# Patient Record
Sex: Female | Born: 1950 | Race: Black or African American | Hispanic: No | Smoking: Former smoker
Health system: Southern US, Community
[De-identification: ages and names within clinical notes are randomized; demographics above are authoritative.]

## PROBLEM LIST (undated history)

## (undated) DIAGNOSIS — E119 Type 2 diabetes mellitus without complications: Secondary | ICD-10-CM

## (undated) DIAGNOSIS — E039 Hypothyroidism, unspecified: Secondary | ICD-10-CM

## (undated) HISTORY — PX: ABDOMINAL HYSTERECTOMY: SHX81

---

## 2004-12-03 ENCOUNTER — Ambulatory Visit: Payer: Self-pay | Admitting: Specialist

## 2007-03-02 ENCOUNTER — Ambulatory Visit: Payer: Self-pay | Admitting: Internal Medicine

## 2007-03-10 ENCOUNTER — Ambulatory Visit: Payer: Self-pay | Admitting: Internal Medicine

## 2008-04-24 ENCOUNTER — Emergency Department: Payer: Self-pay | Admitting: Emergency Medicine

## 2008-06-01 ENCOUNTER — Ambulatory Visit: Payer: Self-pay | Admitting: Internal Medicine

## 2009-05-03 ENCOUNTER — Ambulatory Visit: Payer: Self-pay | Admitting: Gastroenterology

## 2009-06-27 ENCOUNTER — Emergency Department: Payer: Self-pay | Admitting: Emergency Medicine

## 2010-11-28 ENCOUNTER — Ambulatory Visit: Payer: Self-pay

## 2012-11-09 ENCOUNTER — Ambulatory Visit: Payer: Self-pay

## 2014-04-06 ENCOUNTER — Ambulatory Visit: Payer: Self-pay

## 2014-04-06 LAB — CBC WITH DIFFERENTIAL/PLATELET
BASOS PCT: 0.7 %
Basophil #: 0 10*3/uL (ref 0.0–0.1)
Eosinophil #: 0.1 10*3/uL (ref 0.0–0.7)
Eosinophil %: 1.9 %
HCT: 40.6 % (ref 35.0–47.0)
HGB: 13 g/dL (ref 12.0–16.0)
LYMPHS PCT: 36.2 %
Lymphocyte #: 2.5 10*3/uL (ref 1.0–3.6)
MCH: 25.4 pg — ABNORMAL LOW (ref 26.0–34.0)
MCHC: 32.1 g/dL (ref 32.0–36.0)
MCV: 79 fL — ABNORMAL LOW (ref 80–100)
MONO ABS: 0.6 x10 3/mm (ref 0.2–0.9)
MONOS PCT: 8.6 %
NEUTROS PCT: 52.6 %
Neutrophil #: 3.6 10*3/uL (ref 1.4–6.5)
Platelet: 172 10*3/uL (ref 150–440)
RBC: 5.13 10*6/uL (ref 3.80–5.20)
RDW: 17.2 % — ABNORMAL HIGH (ref 11.5–14.5)
WBC: 6.9 10*3/uL (ref 3.6–11.0)

## 2014-04-06 LAB — COMPREHENSIVE METABOLIC PANEL
ALBUMIN: 3.7 g/dL (ref 3.4–5.0)
ALK PHOS: 102 U/L
ALT: 23 U/L
Anion Gap: 8 (ref 7–16)
BUN: 23 mg/dL — ABNORMAL HIGH (ref 7–18)
Bilirubin,Total: 0.3 mg/dL (ref 0.2–1.0)
Calcium, Total: 9.7 mg/dL (ref 8.5–10.1)
Chloride: 104 mmol/L (ref 98–107)
Co2: 31 mmol/L (ref 21–32)
Creatinine: 1.11 mg/dL (ref 0.60–1.30)
EGFR (Non-African Amer.): 53 — ABNORMAL LOW
GLUCOSE: 89 mg/dL (ref 65–99)
OSMOLALITY: 288 (ref 275–301)
POTASSIUM: 3.7 mmol/L (ref 3.5–5.1)
SGOT(AST): 19 U/L (ref 15–37)
SODIUM: 143 mmol/L (ref 136–145)
TOTAL PROTEIN: 7.6 g/dL (ref 6.4–8.2)

## 2014-04-06 LAB — RAPID INFLUENZA A&B ANTIGENS (ARMC ONLY)

## 2015-07-04 ENCOUNTER — Encounter: Payer: Self-pay | Admitting: *Deleted

## 2015-07-04 ENCOUNTER — Emergency Department
Admission: EM | Admit: 2015-07-04 | Discharge: 2015-07-04 | Disposition: A | Discharge status: Home / Self Care | Payer: Worker's Compensation | Attending: Emergency Medicine | Admitting: Emergency Medicine

## 2015-07-04 DIAGNOSIS — Y9289 Other specified places as the place of occurrence of the external cause: Secondary | ICD-10-CM | POA: Insufficient documentation

## 2015-07-04 DIAGNOSIS — F17200 Nicotine dependence, unspecified, uncomplicated: Secondary | ICD-10-CM | POA: Insufficient documentation

## 2015-07-04 DIAGNOSIS — E119 Type 2 diabetes mellitus without complications: Secondary | ICD-10-CM | POA: Insufficient documentation

## 2015-07-04 DIAGNOSIS — Y990 Civilian activity done for income or pay: Secondary | ICD-10-CM | POA: Insufficient documentation

## 2015-07-04 DIAGNOSIS — S0591XA Unspecified injury of right eye and orbit, initial encounter: Secondary | ICD-10-CM | POA: Diagnosis not present

## 2015-07-04 DIAGNOSIS — H209 Unspecified iridocyclitis: Secondary | ICD-10-CM | POA: Diagnosis not present

## 2015-07-04 DIAGNOSIS — W228XXA Striking against or struck by other objects, initial encounter: Secondary | ICD-10-CM | POA: Insufficient documentation

## 2015-07-04 DIAGNOSIS — Y9389 Activity, other specified: Secondary | ICD-10-CM | POA: Insufficient documentation

## 2015-07-04 HISTORY — DX: Type 2 diabetes mellitus without complications: E11.9

## 2015-07-04 MED ORDER — TETRACAINE HCL 0.5 % OP SOLN
1.0000 [drp] | Freq: Once | OPHTHALMIC | Status: AC
  Administered 2015-07-04: 1 [drp] via OPHTHALMIC

## 2015-07-04 MED ORDER — TETRACAINE HCL 0.5 % OP SOLN
OPHTHALMIC | Status: AC
  Administered 2015-07-04: 1 [drp] via OPHTHALMIC
  Filled 2015-07-04: qty 2

## 2015-07-04 MED ORDER — TETRACAINE HCL 0.5 % OP SOLN
OPHTHALMIC | Status: AC
  Filled 2015-07-04: qty 2

## 2015-07-04 MED ORDER — ERYTHROMYCIN 5 MG/GM OP OINT: 1.0000 "application " | TOPICAL_OINTMENT | Freq: Three times a day (TID) | OPHTHALMIC | Status: AC

## 2015-07-04 MED ORDER — FLUORESCEIN SODIUM 1 MG OP STRP
1.0000 | ORAL_STRIP | Freq: Once | OPHTHALMIC | Status: AC
  Administered 2015-07-04: 1 via OPHTHALMIC

## 2015-07-04 MED ORDER — ERYTHROMYCIN 5 MG/GM OP OINT
TOPICAL_OINTMENT | Freq: Once | OPHTHALMIC | Status: AC
  Administered 2015-07-04: 1 via OPHTHALMIC
  Filled 2015-07-04: qty 1

## 2015-07-04 MED ORDER — FLUORESCEIN SODIUM 1 MG OP STRP
ORAL_STRIP | OPHTHALMIC | Status: AC
  Filled 2015-07-04: qty 1

## 2015-07-04 MED ORDER — FLUORESCEIN SODIUM 1 MG OP STRP
ORAL_STRIP | OPHTHALMIC | Status: AC
  Administered 2015-07-04: 1 via OPHTHALMIC
  Filled 2015-07-04: qty 1

## 2015-07-04 NOTE — ED Notes (Signed)
Pt was struck in the left eye with a fall mat.   states WC.  Pt denies other injury.   right eye swelling noted

## 2015-07-04 NOTE — ED Notes (Signed)
Visual acuity L eye 20/25 R eye 20/30

## 2015-07-04 NOTE — ED Provider Notes (Signed)
[facility] Emergency Department Provider Note  ____________________________________________  Time seen: Approximately 4:47 AM  I have reviewed the triage vital signs and the nursing notes.   HISTORY  Chief Complaint Eye Injury    HPI Monique Carlson is a 65 y.o. female who comes into the hospital today after an accident at work. The patient reports that her supervisor told her to get some mats down from a high shelf. The patient reports that when she pulled the mat down it hit her in the right eye. She reports that she was sent over as a precaution because her eye became swollen. She reports that she has some eye discomfort and some mild blurred vision but it's not severe. The patient rates her pain a 7 out of 10 in intensity. The patient does not have any significant drainage from her eye. He did not take anything for pain before coming in she just came to the hospital from work.   Past Medical History  Diagnosis Date  . Diabetes mellitus without complication ([place])     There are no active problems to display for this patient.   Past Surgical History  Procedure Laterality Date  . Abdominal hysterectomy      Current Outpatient Rx  Name  Route  Sig  Dispense  Refill  . erythromycin ophthalmic ointment   Right Eye   Place 1 application into the right eye 3 (three) times daily.   3.5 g   0     Allergies Review of patient's allergies indicates no known allergies.  No family history on file.  Social History Social History  Substance Use Topics  . Smoking status: Current Every Day Smoker  . Smokeless tobacco: Not on file  . Alcohol Use: No    Review of Systems Constitutional: No fever/chills Eyes: Right eye blurred vision. ENT: No sore throat. Cardiovascular: Denies chest pain. Respiratory: Denies shortness of breath. Gastrointestinal: No abdominal pain.  No nausea, no vomiting.  No diarrhea.  No constipation. Genitourinary: Negative for  dysuria. Musculoskeletal: Negative for back pain. Skin: Negative for rash. Neurological: Negative for headaches, focal weakness or numbness.  10-point ROS otherwise negative.  ____________________________________________   PHYSICAL EXAM:  VITAL SIGNS: ED Triage Vitals  Enc Vitals Group     BP 07/04/15 0228 155/61 mmHg     Pulse Rate 07/04/15 0228 68     Resp 07/04/15 0228 20     Temp 07/04/15 0228 98.3 F (36.8 C)     Temp Source 07/04/15 0228 Oral     SpO2 07/04/15 0228 100 %     Weight 07/04/15 0228 162 lb (73.483 kg)     Height 07/04/15 0228 5\' 4"  (1.626 m)     Head Cir --      Peak Flow --      Pain Score 07/04/15 0230 8     Pain Loc --      Pain Edu? --      Excl. in [place]? --     Constitutional: Alert and oriented. Well appearing and in mild distress. Eyes: Conjunctivae are normal. PERRL. EOMI. scleral injection with no uptake on fluorescein staining exam visual acuity is 20/25 in the left eye and 20/30 in the right eye Head: Atraumatic. Nose: No congestion/rhinnorhea. Mouth/Throat: Mucous membranes are moist.  Oropharynx non-erythematous. Cardiovascular: Normal rate, regular rhythm. Grossly normal heart sounds.  Good peripheral circulation. Respiratory: Normal respiratory effort.  No retractions. Lungs CTAB. Gastrointestinal: Soft and nontender. No distention. Positive bowel  sounds Musculoskeletal: No lower extremity tenderness nor edema.   Neurologic:  Normal speech and language.  Skin:  Skin is warm, dry and intact. Psychiatric: Mood and affect are normal.  ____________________________________________   LABS (all labs ordered are listed, but only abnormal results are displayed)  Labs Reviewed - No data to display ____________________________________________  EKG  None ____________________________________________  RADIOLOGY  None ____________________________________________   PROCEDURES  Procedure(s) performed: None  Critical Care performed:  No  ____________________________________________   INITIAL IMPRESSION / ASSESSMENT AND PLAN / ED COURSE  Pertinent labs & imaging results that were available during my care of the patient were reviewed by me and considered in my medical decision making (see chart for details).  This is a 65 year old female who comes into the hospital today with an eye injury at work. The patient did have her eye examined and does not appear to have any corneal abrasions. The patient also does not have a hyphema. I will give the patient some erythromycin eye ointment and have her follow-up with the ophthalmologist. The patient will be discharged home. ____________________________________________   FINAL CLINICAL IMPRESSION(S) / ED DIAGNOSES  Final diagnoses:  Eye injury, right, initial encounter  Iritis      Loney Hering, MD 07/04/15 [phone]

## 2016-03-11 ENCOUNTER — Encounter: Payer: Self-pay | Admitting: Emergency Medicine

## 2016-03-11 ENCOUNTER — Telehealth: Payer: Self-pay | Admitting: Nurse Practitioner

## 2016-03-11 ENCOUNTER — Emergency Department
Admission: EM | Admit: 2016-03-11 | Discharge: 2016-03-11 | Disposition: A | Discharge status: Home / Self Care | Payer: Self-pay | Attending: Emergency Medicine | Admitting: Emergency Medicine

## 2016-03-11 DIAGNOSIS — R739 Hyperglycemia, unspecified: Secondary | ICD-10-CM

## 2016-03-11 DIAGNOSIS — F17290 Nicotine dependence, other tobacco product, uncomplicated: Secondary | ICD-10-CM | POA: Insufficient documentation

## 2016-03-11 DIAGNOSIS — E1165 Type 2 diabetes mellitus with hyperglycemia: Secondary | ICD-10-CM | POA: Insufficient documentation

## 2016-03-11 LAB — BASIC METABOLIC PANEL
Anion gap: 9 (ref 5–15)
BUN: 22 mg/dL — ABNORMAL HIGH (ref 6–20)
CALCIUM: 9.7 mg/dL (ref 8.9–10.3)
CO2: 25 mmol/L (ref 22–32)
Chloride: 98 mmol/L — ABNORMAL LOW (ref 101–111)
Creatinine, Ser: 1.1 mg/dL — ABNORMAL HIGH (ref 0.44–1.00)
GFR calc Af Amer: >60 mL/min (ref >60)
GFR, EST NON AFRICAN AMERICAN: 52 mL/min — AB (ref >60)
GLUCOSE: 531 mg/dL — AB (ref 65–99)
Potassium: 3.7 mmol/L (ref 3.5–5.1)
Sodium: 132 mmol/L — ABNORMAL LOW (ref 135–145)

## 2016-03-11 LAB — URINALYSIS COMPLETE WITH MICROSCOPIC (ARMC ONLY)
BACTERIA UA: NONE SEEN
Bilirubin Urine: NEGATIVE
Ketones, ur: NEGATIVE mg/dL
Leukocytes, UA: NEGATIVE
NITRITE: NEGATIVE
PROTEIN: NEGATIVE mg/dL
SPECIFIC GRAVITY, URINE: 1.02 (ref 1.005–1.030)
pH: 5 (ref 5.0–8.0)

## 2016-03-11 LAB — GLUCOSE, CAPILLARY
GLUCOSE-CAPILLARY: 500 mg/dL — AB (ref 65–99)
GLUCOSE-CAPILLARY: 429 mg/dL — AB (ref 65–99)
GLUCOSE-CAPILLARY: 395 mg/dL — AB (ref 65–99)
Glucose-Capillary: 518 mg/dL (ref 65–99)

## 2016-03-11 LAB — CBC
HEMATOCRIT: 38.7 % (ref 35.0–47.0)
Hemoglobin: 12.5 g/dL (ref 12.0–16.0)
MCH: 25 pg — AB (ref 26.0–34.0)
MCHC: 32.2 g/dL (ref 32.0–36.0)
MCV: 77.6 fL — ABNORMAL LOW (ref 80.0–100.0)
Platelets: 164 10*3/uL (ref 150–440)
RBC: 4.99 MIL/uL (ref 3.80–5.20)
RDW: 17.3 % — AB (ref 11.5–14.5)
WBC: 7 10*3/uL (ref 3.6–11.0)

## 2016-03-11 MED ORDER — GLYBURIDE 5 MG PO TABS: 5.0000 mg | ORAL_TABLET | Freq: Every day | ORAL | 3 refills | Status: AC

## 2016-03-11 MED ORDER — INSULIN ASPART 100 UNIT/ML ~~LOC~~ SOLN
8.0000 [IU] | Freq: Once | SUBCUTANEOUS | Status: AC
  Administered 2016-03-11: 8 [IU] via INTRAVENOUS
  Filled 2016-03-11: qty 8

## 2016-03-11 MED ORDER — SODIUM CHLORIDE 0.9 % IV BOLUS (SEPSIS)
1000.0000 mL | Freq: Once | INTRAVENOUS | Status: AC
  Administered 2016-03-11: 1000 mL via INTRAVENOUS

## 2016-03-11 MED ORDER — GLYBURIDE 5 MG PO TABS
5.0000 mg | ORAL_TABLET | Freq: Every day | ORAL | Status: AC
  Administered 2016-03-11: 5 mg via ORAL
  Filled 2016-03-11: qty 1

## 2016-03-11 MED ORDER — LISINOPRIL 10 MG PO TABS: 10.0000 mg | ORAL_TABLET | Freq: Every day | ORAL | 3 refills | Status: DC

## 2016-03-11 NOTE — Telephone Encounter (Signed)
Patient was referred to [facility] by Dr. Owens Shark in emergency department.  She wants to become a new patient at [facility]

## 2016-03-11 NOTE — ED Triage Notes (Signed)
Patient ambulatory to triage with steady gait, without difficulty or distress noted; pt st FSBS elevated this weekend, off meds x 78months; c/o increased thirst

## 2016-03-11 NOTE — ED Notes (Signed)
Pt c/o foul odor from vagina, polydipsia, urinary frequency. Pt has hx of DMII and has not had meds x 3 months secondary to cost and lack of insurance. Pt requires referral for PCP that will work with her regarding insurance coverage and will be given information regarding medication management clinic.

## 2016-03-11 NOTE — ED Provider Notes (Signed)
[facility] Emergency Department Provider Note   First MD Initiated Contact with Patient 03/11/16 [phone]     (approximate)  I have reviewed the triage vital signs and the nursing notes.   HISTORY  Chief Complaint Hyperglycemia   HPI Monique Carlson is a 65 y.o. female with history of type 2 diabetes presents to the emergency department with polydipsia polyuria and hyperglycemia. Patient states that she has been out of her medications for approximately 3 months secondary to lack of insurance. Patient also admits to a history of hypertension and admits to being noncompliant with medications secondary to beforementioned.   Past Medical History:  Diagnosis Date  . Diabetes mellitus without complication ([place])     There are no active problems to display for this patient.   Past Surgical History:  Procedure Laterality Date  . ABDOMINAL HYSTERECTOMY      Prior to Admission medications   Medication Sig Start Date End Date Taking? Authorizing Provider  glyBURIDE (DIABETA) 5 MG tablet Take 1 tablet (5 mg total) by mouth daily with breakfast. 03/11/16   Gregor Hams, MD  lisinopril (PRINIVIL,ZESTRIL) 10 MG tablet Take 1 tablet (10 mg total) by mouth daily. 03/11/16 03/11/17  Gregor Hams, MD    Allergies No known drug allergies No family history on file.  Social History Social History  Substance Use Topics  . Smoking status: Former Research scientist (life sciences)  . Smokeless tobacco: Current User  . Alcohol use No    Review of Systems Constitutional: No fever/chills Eyes: No visual changes. ENT: No sore throat. Cardiovascular: Denies chest pain. Respiratory: Denies shortness of breath. Gastrointestinal: No abdominal pain.  No nausea, no vomiting.  No diarrhea.  No constipation. Genitourinary: Negative for dysuria. Musculoskeletal: Negative for back pain. Skin: Negative for rash. Neurological: Negative for headaches, focal weakness or numbness.  10-point ROS  otherwise negative.  ____________________________________________   PHYSICAL EXAM:  VITAL SIGNS: ED Triage Vitals [03/11/16 0138]  Enc Vitals Group     BP (!) 173/67     Pulse Rate 73     Resp 20     Temp 97.8 F (36.6 C)     Temp Source Oral     SpO2 100 %     Weight 162 lb (73.5 kg)     Height 5\' 4"  (1.626 m)     Head Circumference      Peak Flow      Pain Score      Pain Loc      Pain Edu?      Excl. in [place]?     Constitutional: Alert and oriented. Well appearing and in no acute distress. Eyes: Conjunctivae are normal. PERRL. EOMI. Head: Atraumatic. Mouth/Throat: Mucous membranes are moist.  Oropharynx non-erythematous. Cardiovascular: Normal rate, regular rhythm. Good peripheral circulation. Grossly normal heart sounds. Respiratory: Normal respiratory effort.  No retractions. Lungs CTAB. Gastrointestinal: Soft and nontender. No distention.  Musculoskeletal: No lower extremity tenderness nor edema. No gross deformities of extremities. Neurologic:  Normal speech and language. No gross focal neurologic deficits are appreciated.  Skin:  Skin is warm, dry and intact. No rash noted. Psychiatric: Mood and affect are normal. Speech and behavior are normal.  ____________________________________________   LABS (all labs ordered are listed, but only abnormal results are displayed)  Labs Reviewed  BASIC METABOLIC PANEL - Abnormal; Notable for the following:       Result Value   Sodium 132 (*)    Chloride 98 (*)  Glucose, Bld 531 (*)    BUN 22 (*)    Creatinine, Ser 1.10 (*)    GFR calc non Af Amer 52 (*)    All other components within normal limits  CBC - Abnormal; Notable for the following:    MCV 77.6 (*)    MCH 25.0 (*)    RDW 17.3 (*)    All other components within normal limits  URINALYSIS COMPLETEWITH MICROSCOPIC ([facility] ONLY) - Abnormal; Notable for the following:    Color, Urine STRAW (*)    APPearance CLEAR (*)    Glucose, UA >500 (*)    Hgb urine  dipstick 2+ (*)    Squamous Epithelial / LPF 0-5 (*)    All other components within normal limits  GLUCOSE, CAPILLARY - Abnormal; Notable for the following:    Glucose-Capillary 500 (*)    All other components within normal limits  GLUCOSE, CAPILLARY - Abnormal; Notable for the following:    Glucose-Capillary 518 (*)    All other components within normal limits  GLUCOSE, CAPILLARY - Abnormal; Notable for the following:    Glucose-Capillary 429 (*)    All other components within normal limits  CBG MONITORING, ED  CBG MONITORING, ED      Procedures     INITIAL IMPRESSION / ASSESSMENT AND PLAN / ED COURSE  Pertinent labs & imaging results that were available during my care of the patient were reviewed by me and considered in my medical decision making (see chart for details).  Patient received 2 L IV normal saline and 8 units subcutaneous insulin with improvement of glucose. Patient given dose of glyburide before leaving the emergency department. Patient was prescribed glyburide and lisinopril (patient states this is what she was taking in the past). [facility]   Clinical Course    ____________________________________________  FINAL CLINICAL IMPRESSION(S) / ED DIAGNOSES  Final diagnoses:  Hyperglycemia   type 2 diabetes mellitus   MEDICATIONS GIVEN DURING THIS VISIT:  Medications  sodium chloride 0.9 % bolus 1,000 mL (0 mLs Intravenous Stopped 03/11/16 0408)  insulin aspart (novoLOG) injection 8 Units (8 Units Intravenous Given 03/11/16 0345)     NEW OUTPATIENT MEDICATIONS STARTED DURING THIS VISIT:  New Prescriptions   GLYBURIDE (DIABETA) 5 MG TABLET    Take 1 tablet (5 mg total) by mouth daily with breakfast.   LISINOPRIL (PRINIVIL,ZESTRIL) 10 MG TABLET    Take 1 tablet (10 mg total) by mouth daily.    Modified Medications   No medications on file    Discontinued Medications   No medications on file     Note:  This document  was prepared using Dragon voice recognition software and may include unintentional dictation errors.    Gregor Hams, MD 03/11/16 [phone]

## 2016-06-11 ENCOUNTER — Other Ambulatory Visit: Payer: Self-pay | Admitting: Nurse Practitioner

## 2016-06-11 DIAGNOSIS — Z1231 Encounter for screening mammogram for malignant neoplasm of breast: Secondary | ICD-10-CM

## 2016-06-19 ENCOUNTER — Other Ambulatory Visit: Payer: Self-pay | Admitting: Physician Assistant

## 2016-06-19 DIAGNOSIS — E049 Nontoxic goiter, unspecified: Secondary | ICD-10-CM

## 2016-06-25 ENCOUNTER — Ambulatory Visit: Payer: BLUE CROSS/BLUE SHIELD

## 2016-07-09 ENCOUNTER — Ambulatory Visit
Admission: RE | Admit: 2016-07-09 | Discharge: 2016-07-09 | Disposition: A | Discharge status: Home / Self Care | Payer: BLUE CROSS/BLUE SHIELD | Source: Ambulatory Visit | Attending: Nurse Practitioner | Admitting: Nurse Practitioner

## 2016-07-09 DIAGNOSIS — Z1231 Encounter for screening mammogram for malignant neoplasm of breast: Secondary | ICD-10-CM | POA: Insufficient documentation

## 2016-07-10 ENCOUNTER — Ambulatory Visit: Payer: BLUE CROSS/BLUE SHIELD

## 2016-07-21 ENCOUNTER — Other Ambulatory Visit: Payer: Self-pay | Admitting: *Deleted

## 2016-07-21 ENCOUNTER — Inpatient Hospital Stay
Admission: RE | Admit: 2016-07-21 | Discharge: 2016-07-21 | Disposition: A | Discharge status: Home / Self Care | Payer: Self-pay | Source: Ambulatory Visit | Attending: *Deleted | Admitting: *Deleted

## 2016-07-21 DIAGNOSIS — Z1231 Encounter for screening mammogram for malignant neoplasm of breast: Secondary | ICD-10-CM

## 2017-06-29 ENCOUNTER — Other Ambulatory Visit: Payer: Self-pay | Admitting: Physician Assistant

## 2017-06-29 DIAGNOSIS — I10 Essential (primary) hypertension: Secondary | ICD-10-CM | POA: Diagnosis not present

## 2017-06-29 DIAGNOSIS — Z1231 Encounter for screening mammogram for malignant neoplasm of breast: Secondary | ICD-10-CM

## 2017-06-29 DIAGNOSIS — Z0000 Encounter for general adult medical examination without abnormal findings: Secondary | ICD-10-CM | POA: Diagnosis not present

## 2017-06-29 DIAGNOSIS — Z1211 Encounter for screening for malignant neoplasm of colon: Secondary | ICD-10-CM | POA: Diagnosis not present

## 2017-06-29 DIAGNOSIS — Z1389 Encounter for screening for other disorder: Secondary | ICD-10-CM | POA: Diagnosis not present

## 2017-06-29 DIAGNOSIS — Z13820 Encounter for screening for osteoporosis: Secondary | ICD-10-CM

## 2017-06-29 DIAGNOSIS — Z23 Encounter for immunization: Secondary | ICD-10-CM | POA: Diagnosis not present

## 2017-07-13 ENCOUNTER — Telehealth: Payer: Self-pay

## 2017-07-16 NOTE — Telephone Encounter (Signed)
Gastroenterology Pre-Procedure Review  Request Date: 07/30/17 Requesting Physician: Dr. Vicente Males   PATIENT REVIEW QUESTIONS: The patient responded to the following health history questions as indicated:    1. Are you having any GI issues? No  2. Do you have a personal history of Polyps? No  3. Do you have a family history of Colon Cancer or Polyps? No  4. Diabetes Mellitus? Yes  5. Joint replacements in the past 12 months? No  6. Major health problems in the past 3 months? No  7. Any artificial heart valves, MVP, or defibrillator?no     MEDICATIONS & ALLERGIES:    Patient reports the following regarding taking any anticoagulation/antiplatelet therapy:   Plavix, Coumadin, Eliquis, Xarelto, Lovenox, Pradaxa, Brilinta, or Effient? No  Aspirin? No   Patient confirms/reports the following medications:  Current Outpatient Medications  Medication Sig Dispense Refill  . amLODipine (NORVASC) 5 MG tablet Take 5 mg by mouth daily.    Marland Kitchen glyBURIDE (DIABETA) 5 MG tablet Take 1 tablet (5 mg total) by mouth daily with breakfast. 30 tablet 3  . insulin detemir (LEVEMIR) 100 UNIT/ML injection Inject 10 Units into the skin at bedtime.    Marland Kitchen levothyroxine (SYNTHROID, LEVOTHROID) 25 MCG tablet Take 25 mcg by mouth daily before breakfast.    . linagliptin (TRADJENTA) 5 MG TABS tablet Take 5 mg by mouth daily.    . meloxicam (MOBIC) 15 MG tablet Take 15 mg by mouth daily.    . Omega-3 Fatty Acids (FISH OIL PO) Take by mouth.     No current facility-administered medications for this visit.     Patient confirms/reports the following allergies:  Allergies  Allergen Reactions  . Metformin And Related     No orders of the defined types were placed in this encounter.   AUTHORIZATION INFORMATION Primary Insurance: 1D#: Group #:  Secondary Insurance: 1D#: Group #:  SCHEDULE INFORMATION: Date: 07/30/17 Time: Location: [place]

## 2017-07-18 ENCOUNTER — Other Ambulatory Visit: Payer: Self-pay

## 2017-07-18 DIAGNOSIS — Z1211 Encounter for screening for malignant neoplasm of colon: Secondary | ICD-10-CM

## 2017-07-23 ENCOUNTER — Ambulatory Visit
Admission: RE | Admit: 2017-07-23 | Discharge: 2017-07-23 | Disposition: A | Discharge status: Home / Self Care | Payer: Medicare HMO | Source: Ambulatory Visit | Attending: Physician Assistant | Admitting: Physician Assistant

## 2017-07-23 DIAGNOSIS — Z780 Asymptomatic menopausal state: Secondary | ICD-10-CM | POA: Diagnosis not present

## 2017-07-23 DIAGNOSIS — Z1231 Encounter for screening mammogram for malignant neoplasm of breast: Secondary | ICD-10-CM | POA: Insufficient documentation

## 2017-07-23 DIAGNOSIS — Z13820 Encounter for screening for osteoporosis: Secondary | ICD-10-CM | POA: Diagnosis not present

## 2017-07-29 DIAGNOSIS — R69 Illness, unspecified: Secondary | ICD-10-CM | POA: Diagnosis not present

## 2017-07-30 ENCOUNTER — Encounter: Payer: Self-pay | Admitting: Anesthesiology

## 2017-07-30 ENCOUNTER — Ambulatory Visit: Payer: Medicare HMO | Admitting: Anesthesiology

## 2017-07-30 ENCOUNTER — Encounter
Admission: RE | Disposition: A | Discharge status: Home / Self Care | Payer: Self-pay | Source: Ambulatory Visit | Attending: Gastroenterology

## 2017-07-30 ENCOUNTER — Ambulatory Visit
Admission: RE | Admit: 2017-07-30 | Discharge: 2017-07-30 | Disposition: A | Discharge status: Home / Self Care | Payer: Medicare HMO | Source: Ambulatory Visit | Attending: Gastroenterology | Admitting: Gastroenterology

## 2017-07-30 DIAGNOSIS — E119 Type 2 diabetes mellitus without complications: Secondary | ICD-10-CM | POA: Insufficient documentation

## 2017-07-30 DIAGNOSIS — Z1211 Encounter for screening for malignant neoplasm of colon: Secondary | ICD-10-CM | POA: Diagnosis not present

## 2017-07-30 DIAGNOSIS — Z87891 Personal history of nicotine dependence: Secondary | ICD-10-CM | POA: Diagnosis not present

## 2017-07-30 DIAGNOSIS — Z794 Long term (current) use of insulin: Secondary | ICD-10-CM | POA: Insufficient documentation

## 2017-07-30 DIAGNOSIS — Z79890 Hormone replacement therapy (postmenopausal): Secondary | ICD-10-CM | POA: Insufficient documentation

## 2017-07-30 DIAGNOSIS — K5790 Diverticulosis of intestine, part unspecified, without perforation or abscess without bleeding: Secondary | ICD-10-CM | POA: Diagnosis not present

## 2017-07-30 DIAGNOSIS — E039 Hypothyroidism, unspecified: Secondary | ICD-10-CM | POA: Insufficient documentation

## 2017-07-30 DIAGNOSIS — Z79899 Other long term (current) drug therapy: Secondary | ICD-10-CM | POA: Diagnosis not present

## 2017-07-30 DIAGNOSIS — K5730 Diverticulosis of large intestine without perforation or abscess without bleeding: Secondary | ICD-10-CM

## 2017-07-30 HISTORY — DX: Hypothyroidism, unspecified: E03.9

## 2017-07-30 HISTORY — PX: COLONOSCOPY WITH PROPOFOL: SHX5780

## 2017-07-30 LAB — GLUCOSE, CAPILLARY: Glucose-Capillary: 204 mg/dL — ABNORMAL HIGH (ref 65–99)

## 2017-07-30 SURGERY — COLONOSCOPY WITH PROPOFOL
Anesthesia: General

## 2017-07-30 MED ORDER — FENTANYL CITRATE (PF) 100 MCG/2ML IJ SOLN
INTRAMUSCULAR | Status: DC | PRN
  Administered 2017-07-30: 50 ug via INTRAVENOUS

## 2017-07-30 MED ORDER — PROPOFOL 500 MG/50ML IV EMUL
INTRAVENOUS | Status: AC
  Filled 2017-07-30: qty 50

## 2017-07-30 MED ORDER — PROPOFOL 500 MG/50ML IV EMUL
INTRAVENOUS | Status: DC | PRN
  Administered 2017-07-30: 120 ug/kg/min via INTRAVENOUS

## 2017-07-30 MED ORDER — FENTANYL CITRATE (PF) 100 MCG/2ML IJ SOLN
INTRAMUSCULAR | Status: AC
  Filled 2017-07-30: qty 2

## 2017-07-30 MED ORDER — SODIUM CHLORIDE 0.9 % IV SOLN
INTRAVENOUS | Status: DC
  Administered 2017-07-30: 1000 mL via INTRAVENOUS

## 2017-07-30 MED ORDER — MIDAZOLAM HCL 2 MG/2ML IJ SOLN
INTRAMUSCULAR | Status: AC
  Filled 2017-07-30: qty 2

## 2017-07-30 MED ORDER — MIDAZOLAM HCL 2 MG/2ML IJ SOLN
INTRAMUSCULAR | Status: DC | PRN
  Administered 2017-07-30: 2 mg via INTRAVENOUS

## 2017-07-30 NOTE — Anesthesia Procedure Notes (Signed)
Performed by: Vaughan Sine Pre-anesthesia Checklist: Patient identified, Emergency Drugs available, Suction available, Patient being monitored and Timeout performed Patient Re-evaluated:Patient Re-evaluated prior to induction Oxygen Delivery Method: Nasal cannula Preoxygenation: Pre-oxygenation with 100% oxygen Induction Type: IV induction Ventilation: Oral airway inserted - appropriate to patient size and Nasal airway inserted- appropriate to patient size Placement Confirmation: CO2 detector and positive ETCO2

## 2017-07-30 NOTE — Anesthesia Post-op Follow-up Note (Signed)
Anesthesia QCDR form completed.        

## 2017-07-30 NOTE — H&P (Signed)
Jonathon Bellows, MD [address], [place], [place], [place], [address] [id number], [place], [place], [place], [address] Phone: [phone]  Fax: [phone]  Primary Care Physician:  Ronnell Freshwater, NP   Pre-Procedure History & Physical: HPI:  Monique Carlson is a 67 y.o. female is here for an colonoscopy.   Past Medical History:  Diagnosis Date  . Diabetes mellitus without complication ([place])   . Hypothyroidism     Past Surgical History:  Procedure Laterality Date  . ABDOMINAL HYSTERECTOMY      Prior to Admission medications   Medication Sig Start Date End Date Taking? Authorizing Provider  amLODipine (NORVASC) 5 MG tablet Take 5 mg by mouth daily.   Yes [provider]  glyBURIDE (DIABETA) 5 MG tablet Take 1 tablet (5 mg total) by mouth daily with breakfast. 03/11/16  Yes Gregor Hams, MD  insulin detemir (LEVEMIR) 100 UNIT/ML injection Inject 10 Units into the skin at bedtime.   Yes [provider]  levothyroxine (SYNTHROID, LEVOTHROID) 25 MCG tablet Take 25 mcg by mouth daily before breakfast.   Yes [provider]  linagliptin (TRADJENTA) 5 MG TABS tablet Take 5 mg by mouth daily.   Yes [provider]  meloxicam (MOBIC) 15 MG tablet Take 15 mg by mouth daily.   Yes [provider]  Omega-3 Fatty Acids (FISH OIL PO) Take by mouth.   Yes [provider]    Allergies as of 07/20/2017 - Review Complete 07/16/2017  Allergen Reaction Noted  . Metformin and related  07/16/2017    History reviewed. No pertinent family history.  Social History   Socioeconomic History  . Marital status: Widowed    Spouse name: Not on file  . Number of children: Not on file  . Years of education: Not on file  . Highest education level: Not on file  Social Needs  . Financial resource strain: Not on file  . Food insecurity - worry: Not on file  . Food insecurity - inability: Not on file  . Transportation needs -  medical: Not on file  . Transportation needs - non-medical: Not on file  Occupational History  . Not on file  Tobacco Use  . Smoking status: Former Research scientist (life sciences)  . Smokeless tobacco: Current User  Substance and Sexual Activity  . Alcohol use: No  . Drug use: Not on file  . Sexual activity: Not on file  Other Topics Concern  . Not on file  Social History Narrative  . Not on file    Review of Systems: See HPI, otherwise negative ROS  Physical Exam: BP (!) 122/50   Pulse 71   Temp 97.7 F (36.5 C) (Tympanic)   Resp 20   Ht 5\' 4"  (1.626 m)   Wt 180 lb (81.6 kg)   SpO2 100%   BMI 30.90 kg/m  General:   Alert,  pleasant and cooperative in NAD Head:  Normocephalic and atraumatic. Neck:  Supple; no masses or thyromegaly. Lungs:  Clear throughout to auscultation, normal respiratory effort.    Heart:  +S1, +S2, Regular rate and rhythm, No edema. Abdomen:  Soft, nontender and nondistended. Normal bowel sounds, without guarding, and without rebound.   Neurologic:  Alert and  oriented x4;  grossly normal neurologically.  Impression/Plan: EILIYAH REH is here for an colonoscopy to be performed for Screening colonoscopy average risk   Risks, benefits, limitations, and alternatives regarding  colonoscopy have been reviewed with the patient.  Questions have been answered.  All parties agreeable.   Jonathon Bellows, MD  07/30/2017, 8:51 AM

## 2017-07-30 NOTE — Anesthesia Preprocedure Evaluation (Signed)
Anesthesia Evaluation  Patient identified by MRN, date of birth, ID band Patient awake    Reviewed: Allergy & Precautions, H&P , NPO status , Patient's Chart, lab work & pertinent test results, reviewed documented beta blocker date and time   History of Anesthesia Complications Negative for: history of anesthetic complications  Airway Mallampati: III  TM Distance: >3 FB Neck ROM: full    Dental  (+) Dental Advidsory Given, Missing   Pulmonary neg pulmonary ROS, former smoker,           Cardiovascular Exercise Tolerance: Good (-) hypertension(-) angina(-) CAD, (-) Past MI, (-) Cardiac Stents and (-) CABG (-) dysrhythmias + Valvular Problems/Murmurs      Neuro/Psych negative neurological ROS  negative psych ROS   GI/Hepatic negative GI ROS, Neg liver ROS,   Endo/Other  diabetesHypothyroidism   Renal/GU negative Renal ROS  negative genitourinary   Musculoskeletal   Abdominal   Peds  Hematology negative hematology ROS (+)   Anesthesia Other Findings Past Medical History: No date: Diabetes mellitus without complication (H[facility]) No date: Hypothyroidism   Reproductive/Obstetrics negative OB ROS                             Anesthesia Physical Anesthesia Plan  ASA: II  Anesthesia Plan: General   Post-op Pain Management:    Induction: Intravenous  PONV Risk Score and Plan: 3 and Propofol infusion  Airway Management Planned: Natural Airway and Nasal Cannula  Additional Equipment:   Intra-op Plan:   Post-operative Plan:   Informed Consent: I have reviewed the patients History and Physical, chart, labs and discussed the procedure including the risks, benefits and alternatives for the proposed anesthesia with the patient or authorized representative who has indicated his/her understanding and acceptance.   Dental Advisory Given  Plan Discussed with: Anesthesiologist, CRNA and  Surgeon  Anesthesia Plan Comments:         Anesthesia Quick Evaluation

## 2017-07-30 NOTE — Transfer of Care (Signed)
Immediate Anesthesia Transfer of Care Note  Patient: Monique Carlson  Procedure(s) Performed: COLONOSCOPY WITH PROPOFOL (N/A )  Patient Location: PACU  Anesthesia Type:General  Level of Consciousness: awake and sedated  Airway & Oxygen Therapy: Patient Spontanous Breathing and Patient connected to face mask oxygen  Post-op Assessment: Report given to RN and Post -op Vital signs reviewed and stable  Post vital signs: Reviewed and stable  Last Vitals:  Vitals:   07/30/17 0846  BP: (!) 122/50  Pulse: 71  Resp: 20  Temp: 36.5 C  SpO2: 100%    Last Pain:  Vitals:   07/30/17 0846  TempSrc: Tympanic         Complications: No apparent anesthesia complications

## 2017-07-30 NOTE — Op Note (Signed)
[facility] Patient Name: Monique Carlson Procedure Date: 07/30/2017 9:32 AM MRN: [id number] Account #: [id number] Date of Birth: [birth year] Admit Type: Outpatient Age: 67 Room: [facility] ENDO ROOM 4 Gender: Female Note Status: Finalized Procedure:            Colonoscopy Indications:          Screening for colorectal malignant neoplasm Providers:            Jonathon Bellows MD, MD Referring MD:         No Local Md, MD (Referring MD) Medicines:            Monitored Anesthesia Care Complications:        No immediate complications. Procedure:            Pre-Anesthesia Assessment:                       - Prior to the procedure, a History and Physical was                        performed, and patient medications, allergies and                        sensitivities were reviewed. The patient's tolerance of                        previous anesthesia was reviewed.                       - The risks and benefits of the procedure and the                        sedation options and risks were discussed with the                        patient. All questions were answered and informed                        consent was obtained.                       - ASA Grade Assessment: III - A patient with severe                        systemic disease.                       After obtaining informed consent, the colonoscope was                        passed under direct vision. Throughout the procedure,                        the patient's blood pressure, pulse, and oxygen                        saturations were monitored continuously. The                        Colonoscope was introduced through the anus and  advanced to the the cecum, identified by the                        appendiceal orifice, IC valve and transillumination.                        The colonoscopy was performed with ease. The patient                        tolerated the procedure well. The  quality of the bowel                        preparation was good. Findings:      The perianal and digital rectal examinations were normal.      Multiple small-mouthed diverticula were found in the entire colon.      The exam was otherwise without abnormality on direct and retroflexion       views. Impression:           - Diverticulosis in the entire examined colon.                       - The examination was otherwise normal on direct and                        retroflexion views.                       - No specimens collected. Recommendation:       - Discharge patient to home (with escort).                       - Resume previous diet.                       - Continue present medications.                       - Repeat colonoscopy in 10 years for screening purposes. Procedure Code(s):    --- Professional ---                       [phone], Colonoscopy, flexible; diagnostic, including                        collection of specimen(s) by brushing or washing, when                        performed (separate procedure) Diagnosis Code(s):    --- Professional ---                       Z12.11, Encounter for screening for malignant neoplasm                        of colon                       K57.30, Diverticulosis of large intestine without                        perforation or abscess without bleeding CPT copyright 2016 American Medical Association. All rights reserved. The codes documented in this report are  preliminary and upon coder review may  be revised to meet current compliance requirements. Jonathon Bellows, MD Jonathon Bellows MD, MD 07/30/2017 9:53:44 AM This report has been signed electronically. Number of Addenda: 0 Note Initiated On: 07/30/2017 9:32 AM Scope Withdrawal Time: 0 hours 11 minutes 25 seconds  Total Procedure Duration: 0 hours 14 minutes 43 seconds       [facility]

## 2017-07-31 ENCOUNTER — Encounter: Payer: Self-pay | Admitting: Gastroenterology

## 2017-08-02 NOTE — Anesthesia Postprocedure Evaluation (Signed)
Anesthesia Post Note  Patient: CRYTAL PENSINGER  Procedure(s) Performed: COLONOSCOPY WITH PROPOFOL (N/A )  Patient location during evaluation: Endoscopy Anesthesia Type: General Level of consciousness: awake and alert Pain management: pain level controlled Vital Signs Assessment: post-procedure vital signs reviewed and stable Respiratory status: spontaneous breathing, nonlabored ventilation, respiratory function stable and patient connected to nasal cannula oxygen Cardiovascular status: blood pressure returned to baseline and stable Postop Assessment: no apparent nausea or vomiting Anesthetic complications: no     Last Vitals:  Vitals:   07/30/17 0846 07/30/17 0958  BP: (!) 122/50 136/64  Pulse: 71 69  Resp: 20 13  Temp: 36.5 C (!) 36.1 C  SpO2: 100% 100%    Last Pain:  Vitals:   07/30/17 0958  TempSrc: Tympanic                 Martha Clan

## 2017-08-06 DIAGNOSIS — E1165 Type 2 diabetes mellitus with hyperglycemia: Secondary | ICD-10-CM | POA: Diagnosis not present

## 2017-08-06 DIAGNOSIS — R69 Illness, unspecified: Secondary | ICD-10-CM | POA: Diagnosis not present

## 2017-08-06 DIAGNOSIS — Z1329 Encounter for screening for other suspected endocrine disorder: Secondary | ICD-10-CM | POA: Diagnosis not present

## 2017-08-26 ENCOUNTER — Ambulatory Visit: Payer: Self-pay | Admitting: Internal Medicine

## 2017-09-14 DIAGNOSIS — R69 Illness, unspecified: Secondary | ICD-10-CM | POA: Diagnosis not present

## 2017-09-28 DIAGNOSIS — R29898 Other symptoms and signs involving the musculoskeletal system: Secondary | ICD-10-CM | POA: Diagnosis not present

## 2017-09-28 DIAGNOSIS — E1165 Type 2 diabetes mellitus with hyperglycemia: Secondary | ICD-10-CM | POA: Diagnosis not present

## 2017-09-28 DIAGNOSIS — I10 Essential (primary) hypertension: Secondary | ICD-10-CM | POA: Diagnosis not present

## 2017-09-28 DIAGNOSIS — Z1329 Encounter for screening for other suspected endocrine disorder: Secondary | ICD-10-CM | POA: Diagnosis not present

## 2017-10-14 DIAGNOSIS — G8929 Other chronic pain: Secondary | ICD-10-CM | POA: Diagnosis not present

## 2017-10-14 DIAGNOSIS — Z794 Long term (current) use of insulin: Secondary | ICD-10-CM | POA: Diagnosis not present

## 2017-10-14 DIAGNOSIS — Z791 Long term (current) use of non-steroidal anti-inflammatories (NSAID): Secondary | ICD-10-CM | POA: Diagnosis not present

## 2017-10-14 DIAGNOSIS — E039 Hypothyroidism, unspecified: Secondary | ICD-10-CM | POA: Diagnosis not present

## 2017-10-14 DIAGNOSIS — E669 Obesity, unspecified: Secondary | ICD-10-CM | POA: Diagnosis not present

## 2017-10-14 DIAGNOSIS — I10 Essential (primary) hypertension: Secondary | ICD-10-CM | POA: Diagnosis not present

## 2017-10-14 DIAGNOSIS — Z8249 Family history of ischemic heart disease and other diseases of the circulatory system: Secondary | ICD-10-CM | POA: Diagnosis not present

## 2017-10-14 DIAGNOSIS — K08409 Partial loss of teeth, unspecified cause, unspecified class: Secondary | ICD-10-CM | POA: Diagnosis not present

## 2017-10-14 DIAGNOSIS — Z6831 Body mass index (BMI) 31.0-31.9, adult: Secondary | ICD-10-CM | POA: Diagnosis not present

## 2017-10-14 DIAGNOSIS — R69 Illness, unspecified: Secondary | ICD-10-CM | POA: Diagnosis not present

## 2017-10-14 DIAGNOSIS — E119 Type 2 diabetes mellitus without complications: Secondary | ICD-10-CM | POA: Diagnosis not present

## 2017-10-20 DIAGNOSIS — R69 Illness, unspecified: Secondary | ICD-10-CM | POA: Diagnosis not present

## 2017-11-05 DIAGNOSIS — I10 Essential (primary) hypertension: Secondary | ICD-10-CM | POA: Diagnosis not present

## 2017-11-05 DIAGNOSIS — E1165 Type 2 diabetes mellitus with hyperglycemia: Secondary | ICD-10-CM | POA: Diagnosis not present

## 2017-11-21 DIAGNOSIS — R69 Illness, unspecified: Secondary | ICD-10-CM | POA: Diagnosis not present

## 2017-12-24 DIAGNOSIS — E039 Hypothyroidism, unspecified: Secondary | ICD-10-CM | POA: Diagnosis not present

## 2017-12-24 DIAGNOSIS — E1165 Type 2 diabetes mellitus with hyperglycemia: Secondary | ICD-10-CM | POA: Diagnosis not present

## 2017-12-24 DIAGNOSIS — I10 Essential (primary) hypertension: Secondary | ICD-10-CM | POA: Diagnosis not present

## 2017-12-24 DIAGNOSIS — Z1329 Encounter for screening for other suspected endocrine disorder: Secondary | ICD-10-CM | POA: Diagnosis not present

## 2017-12-24 DIAGNOSIS — R69 Illness, unspecified: Secondary | ICD-10-CM | POA: Diagnosis not present

## 2018-01-21 DIAGNOSIS — E1165 Type 2 diabetes mellitus with hyperglycemia: Secondary | ICD-10-CM | POA: Diagnosis not present

## 2018-01-21 DIAGNOSIS — I10 Essential (primary) hypertension: Secondary | ICD-10-CM | POA: Diagnosis not present

## 2018-02-25 DIAGNOSIS — R69 Illness, unspecified: Secondary | ICD-10-CM | POA: Diagnosis not present

## 2018-03-31 DIAGNOSIS — R69 Illness, unspecified: Secondary | ICD-10-CM | POA: Diagnosis not present

## 2018-06-09 DIAGNOSIS — E039 Hypothyroidism, unspecified: Secondary | ICD-10-CM | POA: Diagnosis not present

## 2018-06-09 DIAGNOSIS — E049 Nontoxic goiter, unspecified: Secondary | ICD-10-CM | POA: Diagnosis not present

## 2018-06-09 DIAGNOSIS — E1165 Type 2 diabetes mellitus with hyperglycemia: Secondary | ICD-10-CM | POA: Diagnosis not present

## 2018-06-09 DIAGNOSIS — Z1389 Encounter for screening for other disorder: Secondary | ICD-10-CM | POA: Diagnosis not present

## 2018-06-09 DIAGNOSIS — R3121 Asymptomatic microscopic hematuria: Secondary | ICD-10-CM | POA: Diagnosis not present

## 2018-06-09 DIAGNOSIS — I10 Essential (primary) hypertension: Secondary | ICD-10-CM | POA: Diagnosis not present

## 2018-07-09 DIAGNOSIS — E119 Type 2 diabetes mellitus without complications: Secondary | ICD-10-CM | POA: Diagnosis not present

## 2018-07-22 DIAGNOSIS — Z23 Encounter for immunization: Secondary | ICD-10-CM | POA: Diagnosis not present

## 2018-07-22 DIAGNOSIS — Z1389 Encounter for screening for other disorder: Secondary | ICD-10-CM | POA: Diagnosis not present

## 2018-07-22 DIAGNOSIS — Z1329 Encounter for screening for other suspected endocrine disorder: Secondary | ICD-10-CM | POA: Diagnosis not present

## 2018-07-22 DIAGNOSIS — Z0000 Encounter for general adult medical examination without abnormal findings: Secondary | ICD-10-CM | POA: Diagnosis not present

## 2018-07-26 DIAGNOSIS — R69 Illness, unspecified: Secondary | ICD-10-CM | POA: Diagnosis not present

## 2018-12-17 DIAGNOSIS — R69 Illness, unspecified: Secondary | ICD-10-CM | POA: Diagnosis not present

## 2019-02-16 DIAGNOSIS — E1165 Type 2 diabetes mellitus with hyperglycemia: Secondary | ICD-10-CM | POA: Diagnosis not present

## 2019-02-16 DIAGNOSIS — E039 Hypothyroidism, unspecified: Secondary | ICD-10-CM | POA: Diagnosis not present

## 2019-02-16 DIAGNOSIS — Z23 Encounter for immunization: Secondary | ICD-10-CM | POA: Diagnosis not present

## 2019-02-16 DIAGNOSIS — L03116 Cellulitis of left lower limb: Secondary | ICD-10-CM | POA: Diagnosis not present

## 2019-03-03 DIAGNOSIS — M170 Bilateral primary osteoarthritis of knee: Secondary | ICD-10-CM | POA: Diagnosis not present

## 2019-03-14 DIAGNOSIS — R69 Illness, unspecified: Secondary | ICD-10-CM | POA: Diagnosis not present

## 2019-05-20 ENCOUNTER — Emergency Department: Payer: Medicare HMO

## 2019-05-20 ENCOUNTER — Encounter: Payer: Self-pay | Admitting: Intensive Care

## 2019-05-20 ENCOUNTER — Other Ambulatory Visit: Payer: Self-pay

## 2019-05-20 ENCOUNTER — Emergency Department
Admission: EM | Admit: 2019-05-20 | Discharge: 2019-05-20 | Disposition: A | Discharge status: Home / Self Care | Payer: Medicare HMO | Attending: Emergency Medicine | Admitting: Emergency Medicine

## 2019-05-20 DIAGNOSIS — R911 Solitary pulmonary nodule: Secondary | ICD-10-CM | POA: Diagnosis not present

## 2019-05-20 DIAGNOSIS — U071 COVID-19: Secondary | ICD-10-CM

## 2019-05-20 DIAGNOSIS — Z794 Long term (current) use of insulin: Secondary | ICD-10-CM | POA: Diagnosis not present

## 2019-05-20 DIAGNOSIS — Z79899 Other long term (current) drug therapy: Secondary | ICD-10-CM | POA: Insufficient documentation

## 2019-05-20 DIAGNOSIS — Z7189 Other specified counseling: Secondary | ICD-10-CM

## 2019-05-20 DIAGNOSIS — E119 Type 2 diabetes mellitus without complications: Secondary | ICD-10-CM | POA: Insufficient documentation

## 2019-05-20 DIAGNOSIS — R5383 Other fatigue: Secondary | ICD-10-CM | POA: Insufficient documentation

## 2019-05-20 DIAGNOSIS — E039 Hypothyroidism, unspecified: Secondary | ICD-10-CM | POA: Insufficient documentation

## 2019-05-20 DIAGNOSIS — R69 Illness, unspecified: Secondary | ICD-10-CM | POA: Diagnosis not present

## 2019-05-20 DIAGNOSIS — R630 Anorexia: Secondary | ICD-10-CM | POA: Insufficient documentation

## 2019-05-20 DIAGNOSIS — R05 Cough: Secondary | ICD-10-CM | POA: Diagnosis not present

## 2019-05-20 DIAGNOSIS — F17228 Nicotine dependence, chewing tobacco, with other nicotine-induced disorders: Secondary | ICD-10-CM | POA: Diagnosis not present

## 2019-05-20 LAB — COMPREHENSIVE METABOLIC PANEL
ALT: 25 U/L (ref 0–44)
AST: 36 U/L (ref 15–41)
Albumin: 3.5 g/dL (ref 3.5–5.0)
Alkaline Phosphatase: 61 U/L (ref 38–126)
Anion gap: 11 (ref 5–15)
BUN: 13 mg/dL (ref 8–23)
CO2: 25 mmol/L (ref 22–32)
Calcium: 8.9 mg/dL (ref 8.9–10.3)
Chloride: 98 mmol/L (ref 98–111)
Creatinine, Ser: 1.12 mg/dL — ABNORMAL HIGH (ref 0.44–1.00)
GFR calc Af Amer: 59 mL/min — ABNORMAL LOW (ref >60)
GFR calc non Af Amer: 51 mL/min — ABNORMAL LOW (ref >60)
Glucose, Bld: 68 mg/dL — ABNORMAL LOW (ref 70–99)
Potassium: 3.5 mmol/L (ref 3.5–5.1)
Sodium: 134 mmol/L — ABNORMAL LOW (ref 135–145)
Total Bilirubin: 0.7 mg/dL (ref 0.3–1.2)
Total Protein: 8.2 g/dL — ABNORMAL HIGH (ref 6.5–8.1)

## 2019-05-20 LAB — URINALYSIS, COMPLETE (UACMP) WITH MICROSCOPIC
Bacteria, UA: NONE SEEN
Bilirubin Urine: NEGATIVE
Glucose, UA: NEGATIVE mg/dL
Hgb urine dipstick: NEGATIVE
Ketones, ur: NEGATIVE mg/dL
Nitrite: NEGATIVE
Protein, ur: 100 mg/dL — AB
Specific Gravity, Urine: 1.014 (ref 1.005–1.030)
pH: 5 (ref 5.0–8.0)

## 2019-05-20 LAB — CBC WITH DIFFERENTIAL/PLATELET
Abs Immature Granulocytes: 0.04 10*3/uL (ref 0.00–0.07)
Basophils Absolute: 0 10*3/uL (ref 0.0–0.1)
Basophils Relative: 0 %
Eosinophils Absolute: 0 10*3/uL (ref 0.0–0.5)
Eosinophils Relative: 0 %
HCT: 34.8 % — ABNORMAL LOW (ref 36.0–46.0)
Hemoglobin: 10.9 g/dL — ABNORMAL LOW (ref 12.0–15.0)
Immature Granulocytes: 1 %
Lymphocytes Relative: 14 %
Lymphs Abs: 0.9 10*3/uL (ref 0.7–4.0)
MCH: 23.9 pg — ABNORMAL LOW (ref 26.0–34.0)
MCHC: 31.3 g/dL (ref 30.0–36.0)
MCV: 76.1 fL — ABNORMAL LOW (ref 80.0–100.0)
Monocytes Absolute: 0.2 10*3/uL (ref 0.1–1.0)
Monocytes Relative: 4 %
Neutro Abs: 5.1 10*3/uL (ref 1.7–7.7)
Neutrophils Relative %: 81 %
Platelets: 269 10*3/uL (ref 150–400)
RBC: 4.57 MIL/uL (ref 3.87–5.11)
RDW: 18.6 % — ABNORMAL HIGH (ref 11.5–15.5)
Smear Review: NORMAL
WBC: 6.2 10*3/uL (ref 4.0–10.5)
nRBC: 0 % (ref 0.0–0.2)

## 2019-05-20 LAB — GLUCOSE, CAPILLARY
Glucose-Capillary: 33 mg/dL — CL (ref 70–99)
Glucose-Capillary: 90 mg/dL (ref 70–99)
Glucose-Capillary: 149 mg/dL — ABNORMAL HIGH (ref 70–99)

## 2019-05-20 MED ORDER — ONDANSETRON 4 MG PO TBDP: 4.0000 mg | ORAL_TABLET | Freq: Four times a day (QID) | ORAL | 0 refills | Status: AC | PRN

## 2019-05-20 MED ORDER — ACETAMINOPHEN 325 MG PO TABS
650.0000 mg | ORAL_TABLET | Freq: Once | ORAL | Status: AC
  Administered 2019-05-20: 650 mg via ORAL
  Filled 2019-05-20: qty 2

## 2019-05-20 MED ORDER — BENZONATATE 100 MG PO CAPS: 100.0000 mg | ORAL_CAPSULE | Freq: Four times a day (QID) | ORAL | 0 refills | Status: DC | PRN

## 2019-05-20 MED ORDER — ONDANSETRON HCL 4 MG/2ML IJ SOLN
4.0000 mg | Freq: Once | INTRAMUSCULAR | Status: AC
  Administered 2019-05-20: 13:00:00 4 mg via INTRAVENOUS
  Filled 2019-05-20: qty 2

## 2019-05-20 MED ORDER — SODIUM CHLORIDE 0.9 % IV BOLUS
1000.0000 mL | Freq: Once | INTRAVENOUS | Status: AC
  Administered 2019-05-20: 1000 mL via INTRAVENOUS

## 2019-05-20 NOTE — ED Triage Notes (Signed)
Patient reports being diagnosed with covid 05/16/19 and now experiencing worsening symptoms of cough, sob, no appetite and frequent urination

## 2019-05-20 NOTE — ED Notes (Signed)
First Nurse Note: Pt states that she tested positive for COVID at her work. Pt ambulatory into ED c/o weakness, cough, and shortness of breath. No respiratory distress is noted at this time. Pt is able to speak in complete sentences at this time.

## 2019-05-20 NOTE — ED Provider Notes (Signed)
[facility] Emergency Department Provider Note   ____________________________________________   First MD Initiated Contact with Patient 05/20/19 1239     (approximate)  I have reviewed the triage vital signs and the nursing notes.   HISTORY  Chief Complaint Cough and Urinary Frequency    HPI Monique Carlson is a 69 y.o. female history of diabetes  Patient reports today that she was diagnosed with coronavirus a few days ago.  She has been having symptoms since Monday.   She reports that she is not short of breath.  She is experiencing a dry cough.  Mostly however she is feeling fatigued, feels like she has no appetite and she is not eating anything.  No vomiting or diarrhea and denies pain except for a mild headache and achiness.  She has not been eating or drinking too well, she has been able to keep a fair amount of water down but otherwise has not had wanted any appetite to eat solid food.  She reports she just cannot seem to taste anything or just taste funny and she does not want to eat  She does occasionally use diet [place]ies medications such as insulin, but only if her blood sugar is elevated greater than 140 and using a sliding scale which she has not used over the last 24 hours.  She does continue to take her oral medications.    Past Medical History:  Diagnosis Date  . Diabetes mellitus without complication ([place])   . Hypothyroidism     There are no problems to display for this patient.   Past Surgical History:  Procedure Laterality Date  . ABDOMINAL HYSTERECTOMY    . COLONOSCOPY WITH PROPOFOL N/A 07/30/2017   Procedure: COLONOSCOPY WITH PROPOFOL;  Surgeon: Jonathon Bellows, MD;  Location: [facility] ENDOSCOPY;  Service: Gastroenterology;  Laterality: N/A;    Prior to Admission medications   Medication Sig Start Date End Date Taking? Authorizing Provider  amLODipine (NORVASC) 5 MG tablet Take 5 mg by mouth daily.    [provider]    benzonatate (TESSALON PERLES) 100 MG capsule Take 1 capsule (100 mg total) by mouth every 6 (six) hours as needed for cough. 05/20/19   Delman Kitten, MD  glyBURIDE (DIABETA) 5 MG tablet Take 1 tablet (5 mg total) by mouth daily with breakfast. 03/11/16   Gregor Hams, MD  insulin detemir (LEVEMIR) 100 UNIT/ML injection Inject 10 Units into the skin at bedtime.    [provider]  levothyroxine (SYNTHROID, LEVOTHROID) 25 MCG tablet Take 25 mcg by mouth daily before breakfast.    [provider]  linagliptin (TRADJENTA) 5 MG TABS tablet Take 5 mg by mouth daily.    [provider]  meloxicam (MOBIC) 15 MG tablet Take 15 mg by mouth daily.    [provider]  Omega-3 Fatty Acids (FISH OIL PO) Take by mouth.    [provider]  ondansetron (ZOFRAN ODT) 4 MG disintegrating tablet Take 1 tablet (4 mg total) by mouth every 6 (six) hours as needed for nausea or vomiting. 05/20/19   Delman Kitten, MD    Allergies Metformin and related  History reviewed. No pertinent family history.  Social History Social History   Tobacco Use  . Smoking status: Former Smoker    Types: Cigarettes  . Smokeless tobacco: Current User  Substance Use Topics  . Alcohol use: Yes    Comment: occ  . Drug use: Never    Review of Systems Constitutional: No  fever/chills Eyes: No visual changes. ENT: No sore throat. Cardiovascular: Denies chest pain. Respiratory: Denies shortness of breath.  Frequent dry cough Gastrointestinal: No abdominal pain.  No appetite. Genitourinary: Negative for dysuria.  Reports urinating frequently for the last few days Musculoskeletal: Negative for back pain. Skin: Negative for rash. Neurological: Negative for areas of focal weakness or numbness.    ____________________________________________   PHYSICAL EXAM:  VITAL SIGNS: ED Triage Vitals  Enc Vitals Group     BP 05/20/19 1150 (!) 151/54     Pulse Rate 05/20/19 1150 77     Resp  05/20/19 1150 18     Temp 05/20/19 1150 97.8 F (36.6 C)     Temp Source 05/20/19 1150 Oral     SpO2 05/20/19 1150 97 %     Weight 05/20/19 1151 180 lb (81.6 kg)     Height 05/20/19 1151 5\' 4"  (1.626 m)     Head Circumference --      Peak Flow --      Pain Score 05/20/19 1150 0     Pain Loc --      Pain Edu? --      Excl. in [place]? --     Constitutional: Alert and oriented. Well appearing and in no acute distress.  She is very pleasant. Eyes: Conjunctivae are normal. Head: Atraumatic. Nose: No congestion/rhinnorhea. Mouth/Throat: No lesions. Neck: No stridor.  Cardiovascular: Normal rate, regular rhythm. Grossly normal heart sounds.  Good peripheral circulation. Respiratory: Normal respiratory effort.  No retractions. Lungs CTAB. Gastrointestinal: Soft and nontender. No distention. Musculoskeletal: No lower extremity tenderness nor edema. Neurologic:  Normal speech and language. No gross focal neurologic deficits are appreciated.  Skin:  Skin is warm, dry and intact. No rash noted.  Somewhat poor skin turgor. Psychiatric: Mood and affect are normal. Speech and behavior are normal.  ____________________________________________   LABS (all labs ordered are listed, but only abnormal results are displayed)  Labs Reviewed  CBC WITH DIFFERENTIAL/PLATELET - Abnormal; Notable for the following components:      Result Value   Hemoglobin 10.9 (*)    HCT 34.8 (*)    MCV 76.1 (*)    MCH 23.9 (*)    RDW 18.6 (*)    All other components within normal limits  COMPREHENSIVE METABOLIC PANEL - Abnormal; Notable for the following components:   Sodium 134 (*)    Glucose, Bld 68 (*)    Creatinine, Ser 1.12 (*)    Total Protein 8.2 (*)    GFR calc non Af Amer 51 (*)    GFR calc Af Amer 59 (*)    All other components within normal limits  GLUCOSE, CAPILLARY - Abnormal; Notable for the following components:   Glucose-Capillary 33 (*)    All other components within normal limits    URINALYSIS, COMPLETE (UACMP) WITH MICROSCOPIC - Abnormal; Notable for the following components:   Color, Urine YELLOW (*)    APPearance HAZY (*)    Protein, ur 100 (*)    Leukocytes,Ua TRACE (*)    Non Squamous Epithelial PRESENT (*)    All other components within normal limits  GLUCOSE, CAPILLARY - Abnormal; Notable for the following components:   Glucose-Capillary 149 (*)    All other components within normal limits  GLUCOSE, CAPILLARY  CBG MONITORING, ED  CBG MONITORING, ED  CBG MONITORING, ED  CBG MONITORING, ED  CBG MONITORING, ED  CBG MONITORING, ED  CBG MONITORING, ED  CBG MONITORING, ED  CBG MONITORING,  ED  CBG MONITORING, ED  CBG MONITORING, ED  CBG MONITORING, ED  CBG MONITORING, ED  CBG MONITORING, ED  CBG MONITORING, ED  CBG MONITORING, ED   ____________________________________________  EKG   ____________________________________________  RADIOLOGY  DG Chest 2 View  Result Date: 05/20/2019 CLINICAL DATA:  Patient reports being diagnosed with covid 05/16/19 and now experiencing worsening symptoms of cough, sob, no appetite and frequent urination. EXAM: CHEST - 2 VIEW COMPARISON:  Chest radiograph 03/02/2007 FINDINGS: Stable cardiomediastinal contours. Heart size is normal. There are diffuse bilateral coarse interstitial opacities favored to represent chronic changes/scarring. No focal consolidation. There is a small asymmetric nodular opacity in the left upper lung which may represent overlapping structures though parenchymal nodule cannot be excluded. No pneumothorax or pleural effusion. No acute finding in the visualized skeleton. IMPRESSION: 1. Diffuse bilateral coarsening of the interstitium favored to represent chronic bronchitis/scarring, though acute component difficult to exclude without recent priors for comparison. 2. Small asymmetric nodular opacity in the left upper lung. Follow-up nonemergent chest CT recommended to exclude underlying parenchymal nodule.  Electronically Signed   By: Audie Pinto M.D.   On: 05/20/2019 12:21     Imaging reviewed, as noted above by radiologist. Nodular left upper lung discussed with patient, and she is agreeable to following with primary care for further evaluation of this ____________________________________________   PROCEDURES  Procedure(s) performed: None  Procedures  Critical Care performed: No  ____________________________________________   INITIAL IMPRESSION / ASSESSMENT AND PLAN / ED COURSE  Pertinent labs & imaging results that were available during my care of the patient were reviewed by me and considered in my medical decision making (see chart for details).   Patient presents for fatigue, dry cough, and reports feeling weakness.  Reassuring clinical examination, however blood glucose is low at 33.  She is able to tolerate juices, oral intake.  I suspect that her lack of appetite and decreased oral intake are cause for hypoglycemia.  Provide antiemetic, the patient to eat and drink, will continue to monitor blood sugar closely.  She is not using her sliding scale insulin at this time and does have access to glucometer readings at home.  She appears to be asymptomatic to her hypoglycemia today  Clinical Course as of May 19 1552  Fri May 20, 2019  1300 Patient alert, conversant without distress, point-of-care glucose is 33 however.  Patient able to take by mouth, juices.  We will continue to monitor closely.   [MQ]  1301 Suspect her hypoglycemia is related to poor appetite, she reports almost no calorie intake over the last day due to lack of taste and no appetite   [MQ]  1447 Patient improving.  Now eating a full meal tray, juices.  She reports she is feeling better.  She appears quite improved at this time, resting comfortably in no distress.  Blood sugar has also improved.  Anticipate discharge with prescription for antiemetic, discussed careful return precautions, how to contact the  [place] hotline, and home treatment recommendations including careful monitoring of her blood sugar.  Patient agreement   [MQ]    Clinical Course User Index [MQ] Delman Kitten, MD   Reassuring vital signs and clinical examination.  I do not see concern for severe case of coronavirus at this time.  However, I am concerned about dehydration and some associated hypoglycemia which we will work to correct.  ----------------------------------------- 3:54 PM on 05/20/2019 -----------------------------------------  She is feeling much better.  Resting comfortably.  Is eating well, blood sugar much improved.  Patient comfortable with plan for discharge, follow-up closely with her primary and also will contact [facility] hotline  Return precautions and treatment recommendations and follow-up discussed with the patient who is agreeable with the plan.  ____________________________________________   FINAL CLINICAL IMPRESSION(S) / ED DIAGNOSES  Final diagnoses:  Incidental lung nodule  Advice given about COVID-19 virus infection  COVID-19 virus infection        Note:  This document was prepared using Dragon voice recognition software and may include unintentional dictation errors       Delman Kitten, MD 05/20/19 1554

## 2019-05-20 NOTE — ED Notes (Signed)
fsbs 149

## 2019-05-20 NOTE — ED Notes (Signed)
Pt assisted to bedside commode

## 2019-05-31 DIAGNOSIS — R911 Solitary pulmonary nodule: Secondary | ICD-10-CM | POA: Diagnosis not present

## 2019-05-31 DIAGNOSIS — E1165 Type 2 diabetes mellitus with hyperglycemia: Secondary | ICD-10-CM | POA: Diagnosis not present

## 2019-05-31 DIAGNOSIS — U071 COVID-19: Secondary | ICD-10-CM | POA: Diagnosis not present

## 2019-06-02 ENCOUNTER — Other Ambulatory Visit: Payer: Self-pay | Admitting: Physician Assistant

## 2019-06-02 DIAGNOSIS — R911 Solitary pulmonary nodule: Secondary | ICD-10-CM

## 2019-06-13 DIAGNOSIS — R69 Illness, unspecified: Secondary | ICD-10-CM | POA: Diagnosis not present

## 2019-06-16 DIAGNOSIS — E1165 Type 2 diabetes mellitus with hyperglycemia: Secondary | ICD-10-CM | POA: Diagnosis not present

## 2019-06-16 DIAGNOSIS — Z719 Counseling, unspecified: Secondary | ICD-10-CM | POA: Diagnosis not present

## 2019-06-16 DIAGNOSIS — R69 Illness, unspecified: Secondary | ICD-10-CM | POA: Diagnosis not present

## 2019-06-16 DIAGNOSIS — R3121 Asymptomatic microscopic hematuria: Secondary | ICD-10-CM | POA: Diagnosis not present

## 2019-07-01 DIAGNOSIS — M170 Bilateral primary osteoarthritis of knee: Secondary | ICD-10-CM | POA: Diagnosis not present

## 2019-07-06 ENCOUNTER — Ambulatory Visit: Payer: Medicare HMO | Attending: Physician Assistant

## 2019-07-14 ENCOUNTER — Other Ambulatory Visit: Payer: Self-pay

## 2019-07-14 ENCOUNTER — Emergency Department
Admission: EM | Admit: 2019-07-14 | Discharge: 2019-07-14 | Disposition: A | Discharge status: Home / Self Care | Payer: Medicare HMO | Attending: Student in an Organized Health Care Education/Training Program | Admitting: Student in an Organized Health Care Education/Training Program

## 2019-07-14 ENCOUNTER — Encounter: Payer: Self-pay | Admitting: Emergency Medicine

## 2019-07-14 ENCOUNTER — Emergency Department: Payer: Medicare HMO

## 2019-07-14 DIAGNOSIS — E039 Hypothyroidism, unspecified: Secondary | ICD-10-CM | POA: Insufficient documentation

## 2019-07-14 DIAGNOSIS — R519 Headache, unspecified: Secondary | ICD-10-CM | POA: Diagnosis not present

## 2019-07-14 DIAGNOSIS — E119 Type 2 diabetes mellitus without complications: Secondary | ICD-10-CM | POA: Diagnosis not present

## 2019-07-14 DIAGNOSIS — Z87891 Personal history of nicotine dependence: Secondary | ICD-10-CM | POA: Diagnosis not present

## 2019-07-14 DIAGNOSIS — R05 Cough: Secondary | ICD-10-CM | POA: Diagnosis not present

## 2019-07-14 DIAGNOSIS — Z794 Long term (current) use of insulin: Secondary | ICD-10-CM | POA: Insufficient documentation

## 2019-07-14 DIAGNOSIS — R9431 Abnormal electrocardiogram [ECG] [EKG]: Secondary | ICD-10-CM | POA: Diagnosis not present

## 2019-07-14 LAB — COMPREHENSIVE METABOLIC PANEL
ALT: 14 U/L (ref 0–44)
AST: 18 U/L (ref 15–41)
Albumin: 4 g/dL (ref 3.5–5.0)
Alkaline Phosphatase: 79 U/L (ref 38–126)
Anion gap: 12 (ref 5–15)
BUN: 16 mg/dL (ref 8–23)
CO2: 25 mmol/L (ref 22–32)
Calcium: 9.5 mg/dL (ref 8.9–10.3)
Chloride: 101 mmol/L (ref 98–111)
Creatinine, Ser: 1.21 mg/dL — ABNORMAL HIGH (ref 0.44–1.00)
GFR calc Af Amer: 54 mL/min — ABNORMAL LOW (ref >60)
GFR calc non Af Amer: 46 mL/min — ABNORMAL LOW (ref >60)
Glucose, Bld: 129 mg/dL — ABNORMAL HIGH (ref 70–99)
Potassium: 3.7 mmol/L (ref 3.5–5.1)
Sodium: 138 mmol/L (ref 135–145)
Total Bilirubin: 0.5 mg/dL (ref 0.3–1.2)
Total Protein: 8.1 g/dL (ref 6.5–8.1)

## 2019-07-14 LAB — CBC
HCT: 37.2 % (ref 36.0–46.0)
Hemoglobin: 11.8 g/dL — ABNORMAL LOW (ref 12.0–15.0)
MCH: 25.3 pg — ABNORMAL LOW (ref 26.0–34.0)
MCHC: 31.7 g/dL (ref 30.0–36.0)
MCV: 79.8 fL — ABNORMAL LOW (ref 80.0–100.0)
Platelets: 218 10*3/uL (ref 150–400)
RBC: 4.66 MIL/uL (ref 3.87–5.11)
RDW: 19 % — ABNORMAL HIGH (ref 11.5–15.5)
WBC: 7.8 10*3/uL (ref 4.0–10.5)
nRBC: 0 % (ref 0.0–0.2)

## 2019-07-14 LAB — URINALYSIS, COMPLETE (UACMP) WITH MICROSCOPIC
Bacteria, UA: NONE SEEN
Bilirubin Urine: NEGATIVE
Glucose, UA: NEGATIVE mg/dL
Ketones, ur: NEGATIVE mg/dL
Leukocytes,Ua: NEGATIVE
Nitrite: NEGATIVE
Protein, ur: NEGATIVE mg/dL
Specific Gravity, Urine: 1.004 — ABNORMAL LOW (ref 1.005–1.030)
pH: 5 (ref 5.0–8.0)

## 2019-07-14 LAB — LIPASE, BLOOD: Lipase: 32 U/L (ref 11–51)

## 2019-07-14 LAB — TROPONIN I (HIGH SENSITIVITY): Troponin I (High Sensitivity): 7 ng/L (ref <18)

## 2019-07-14 MED ORDER — PROCHLORPERAZINE EDISYLATE 10 MG/2ML IJ SOLN
10.0000 mg | Freq: Once | INTRAMUSCULAR | Status: AC
  Administered 2019-07-14: 10 mg via INTRAVENOUS
  Filled 2019-07-14: qty 2

## 2019-07-14 MED ORDER — DIPHENHYDRAMINE HCL 50 MG/ML IJ SOLN
12.5000 mg | Freq: Once | INTRAMUSCULAR | Status: AC
  Administered 2019-07-14: 12.5 mg via INTRAVENOUS
  Filled 2019-07-14: qty 1

## 2019-07-14 MED ORDER — AZITHROMYCIN 500 MG PO TABS: 500.0000 mg | ORAL_TABLET | Freq: Every day | ORAL | 0 refills | Status: AC

## 2019-07-14 MED ORDER — SODIUM CHLORIDE 0.9 % IV BOLUS
500.0000 mL | Freq: Once | INTRAVENOUS | Status: AC
  Administered 2019-07-14: 500 mL via INTRAVENOUS

## 2019-07-14 MED ORDER — DEXAMETHASONE SODIUM PHOSPHATE 10 MG/ML IJ SOLN
10.0000 mg | Freq: Once | INTRAMUSCULAR | Status: AC
  Administered 2019-07-14: 19:00:00 10 mg via INTRAVENOUS
  Filled 2019-07-14: qty 1

## 2019-07-14 MED ORDER — ONDANSETRON HCL 4 MG PO TABS: 4.0000 mg | ORAL_TABLET | Freq: Every day | ORAL | 0 refills | Status: AC | PRN

## 2019-07-14 NOTE — ED Notes (Signed)
Pt denies nausea.

## 2019-07-14 NOTE — ED Provider Notes (Signed)
[facility] Emergency Department Provider Note    First MD Initiated Contact with Patient 07/14/19 1749     (approximate)  I have reviewed the triage vital signs and the nursing notes.   HISTORY  Chief Complaint Abdominal Pain    HPI Monique Carlson is a 69 y.o. female below listed past medical history with diagnosis of Covid back in December presents to the ER for several weeks of progressive worsening nasal congestion intermittent cough chest discomfort frequent daily headaches and nausea.  Symptoms did not become acutely worse today.  Was encouraged by her daughter to be checked out today.  Has had decreased appetite.  Denies any abdominal pain right now.  No chest pain or worsening shortness of breath.  No pleuritic discomfort.  Has not been on any recent antibiotics.    Past Medical History:  Diagnosis Date  . Diabetes mellitus without complication ([place])   . Hypothyroidism    History reviewed. No pertinent family history. Past Surgical History:  Procedure Laterality Date  . ABDOMINAL HYSTERECTOMY    . COLONOSCOPY WITH PROPOFOL N/A 07/30/2017   Procedure: COLONOSCOPY WITH PROPOFOL;  Surgeon: Jonathon Bellows, MD;  Location: [facility] ENDOSCOPY;  Service: Gastroenterology;  Laterality: N/A;   There are no problems to display for this patient.     Prior to Admission medications   Medication Sig Start Date End Date Taking? Authorizing Provider  amLODipine (NORVASC) 5 MG tablet Take 5 mg by mouth daily.    [provider]  azithromycin (ZITHROMAX) 500 MG tablet Take 1 tablet (500 mg total) by mouth daily for 3 days. Take 1 tablet daily for 3 days. 07/14/19 07/17/19  Merlyn Lot, MD  benzonatate (TESSALON PERLES) 100 MG capsule Take 1 capsule (100 mg total) by mouth every 6 (six) hours as needed for cough. 05/20/19   Delman Kitten, MD  glyBURIDE (DIABETA) 5 MG tablet Take 1 tablet (5 mg total) by mouth daily with breakfast. 03/11/16   Gregor Hams, MD  insulin detemir (LEVEMIR) 100 UNIT/ML injection Inject 10 Units into the skin at bedtime.    [provider]  levothyroxine (SYNTHROID, LEVOTHROID) 25 MCG tablet Take 25 mcg by mouth daily before breakfast.    [provider]  linagliptin (TRADJENTA) 5 MG TABS tablet Take 5 mg by mouth daily.    [provider]  meloxicam (MOBIC) 15 MG tablet Take 15 mg by mouth daily.    [provider]  Omega-3 Fatty Acids (FISH OIL PO) Take by mouth.    [provider]  ondansetron (ZOFRAN ODT) 4 MG disintegrating tablet Take 1 tablet (4 mg total) by mouth every 6 (six) hours as needed for nausea or vomiting. 05/20/19   Delman Kitten, MD  ondansetron (ZOFRAN) 4 MG tablet Take 1 tablet (4 mg total) by mouth daily as needed. 07/14/19 07/13/20  Merlyn Lot, MD    Allergies Metformin and related    Social History Social History   Tobacco Use  . Smoking status: Former Smoker    Types: Cigarettes  . Smokeless tobacco: Current User  Substance Use Topics  . Alcohol use: Yes    Comment: occ  . Drug use: Never    Review of Systems Patient denies headaches, rhinorrhea, blurry vision, numbness, shortness of breath, chest pain, edema, cough, abdominal pain, nausea, vomiting, diarrhea, dysuria, fevers, rashes or hallucinations unless otherwise stated above in HPI. ____________________________________________   PHYSICAL EXAM:  VITAL SIGNS: Vitals:   07/14/19 1952 07/14/19 1953  BP:    Pulse:  93  Resp: 17   Temp:    SpO2:  100%    Constitutional: Alert and oriented.  Eyes: Conjunctivae are normal.  Head: Atraumatic. Nose: No congestion/rhinnorhea. Mouth/Throat: Mucous membranes are moist.   Neck: No stridor. Painless ROM.  Cardiovascular: Normal rate, regular rhythm. Grossly normal heart sounds.  Good peripheral circulation. Respiratory: Normal respiratory effort.  No retractions. Lungs CTAB. Gastrointestinal: Soft and nontender. No  distention. No abdominal bruits. No CVA tenderness. Genitourinary:  Musculoskeletal: No lower extremity tenderness nor edema.  No joint effusions. Neurologic:  CN- intact.  No facial droop, Normal FNF.  Normal heel to shin.  Sensation intact bilaterally. Normal speech and language. No gross focal neurologic deficits are appreciated. No gait instability. Skin:  Skin is warm, dry and intact. No rash noted. Psychiatric: Mood and affect are normal. Speech and behavior are normal.  ____________________________________________   LABS (all labs ordered are listed, but only abnormal results are displayed)  Results for orders placed or performed during the hospital encounter of 07/14/19 (from the past 24 hour(s))  Lipase, blood     Status: None   Collection Time: 07/14/19  5:03 PM  Result Value Ref Range   Lipase 32 11 - 51 U/L  Comprehensive metabolic panel     Status: Abnormal   Collection Time: 07/14/19  5:03 PM  Result Value Ref Range   Sodium 138 135 - 145 mmol/L   Potassium 3.7 3.5 - 5.1 mmol/L   Chloride 101 98 - 111 mmol/L   CO2 25 22 - 32 mmol/L   Glucose, Bld 129 (H) 70 - 99 mg/dL   BUN 16 8 - 23 mg/dL   Creatinine, Ser 1.21 (H) 0.44 - 1.00 mg/dL   Calcium 9.5 8.9 - 10.3 mg/dL   Total Protein 8.1 6.5 - 8.1 g/dL   Albumin 4.0 3.5 - 5.0 g/dL   AST 18 15 - 41 U/L   ALT 14 0 - 44 U/L   Alkaline Phosphatase 79 38 - 126 U/L   Total Bilirubin 0.5 0.3 - 1.2 mg/dL   GFR calc non Af Amer 46 (L) >60 mL/min   GFR calc Af Amer 54 (L) >60 mL/min   Anion gap 12 5 - 15  CBC     Status: Abnormal   Collection Time: 07/14/19  5:03 PM  Result Value Ref Range   WBC 7.8 4.0 - 10.5 K/uL   RBC 4.66 3.87 - 5.11 MIL/uL   Hemoglobin 11.8 (L) 12.0 - 15.0 g/dL   HCT 37.2 36.0 - 46.0 %   MCV 79.8 (L) 80.0 - 100.0 fL   MCH 25.3 (L) 26.0 - 34.0 pg   MCHC 31.7 30.0 - 36.0 g/dL   RDW 19.0 (H) 11.5 - 15.5 %   Platelets 218 150 - 400 K/uL   nRBC 0.0 0.0 - 0.2 %  Troponin I (High Sensitivity)      Status: None   Collection Time: 07/14/19  5:03 PM  Result Value Ref Range   Troponin I (High Sensitivity) 7 <18 ng/L  Urinalysis, Complete w Microscopic     Status: Abnormal   Collection Time: 07/14/19  7:53 PM  Result Value Ref Range   Color, Urine STRAW (A) YELLOW   APPearance HAZY (A) CLEAR   Specific Gravity, Urine 1.004 (L) 1.005 - 1.030   pH 5.0 5.0 - 8.0   Glucose, UA NEGATIVE NEGATIVE mg/dL   Hgb urine dipstick SMALL (A) NEGATIVE   Bilirubin Urine  NEGATIVE NEGATIVE   Ketones, ur NEGATIVE NEGATIVE mg/dL   Protein, ur NEGATIVE NEGATIVE mg/dL   Nitrite NEGATIVE NEGATIVE   Leukocytes,Ua NEGATIVE NEGATIVE   RBC / HPF 0-5 0 - 5 RBC/hpf   WBC, UA 0-5 0 - 5 WBC/hpf   Bacteria, UA NONE SEEN NONE SEEN   Squamous Epithelial / LPF 0-5 0 - 5   ____________________________________________  EKG My review and personal interpretation at Time: 17:09   Indication: congestion  Rate: 80  Rhythm: sinus Axis: left Other: nonspecific st abn, no stemi ____________________________________________  RADIOLOGY  Mix one tablespoon with 8oz of your favorite juice or water every day until you are having soft formed stools. Then start taking once daily if you didn't have a stool the day before.  ____________________________________________   PROCEDURES  Procedure(s) performed:  Procedures    Critical Care performed: no ____________________________________________   INITIAL IMPRESSION / ASSESSMENT AND PLAN / ED COURSE  Pertinent labs & imaging results that were available during my care of the patient were reviewed by me and considered in my medical decision making (see chart for details).   DDX: URI, sinusitis, dehydration, migraine, tension, pneumonia, enteritis,  Monique Carlson is a 69 y.o. who presents to the ED with symptoms as described above.  Seems to been having several days to weeks of recurrent lingering symptoms.  She is not have any signs of meningismus.  Does not seem  consistent with ACS.  Her abdominal exam is soft and benign.  Does have some frontal sinus tenderness possible sinusitis.  Denies some consistent with SAH, SDH.  No focal neuro deficits.  Chest x-ray without any evidence of edema or acute pneumonia.    Clinical Course as of Jul 13 2014  Thu Jul 14, 2019  [id number] Patient reassessed.  Feels much improved after migraine cocktail.  She is tolerating p.o.  Repeat exam is reassuring.   [PR]  2009 Nitrite: NEGATIVE [PR]    Clinical Course User Index [PR] Merlyn Lot, MD    The patient was evaluated in Emergency Department today for the symptoms described in the history of present illness. He/she was evaluated in the context of the global COVID-19 pandemic, which necessitated consideration that the patient might be at risk for infection with the SARS-CoV-2 virus that causes COVID-19. Institutional protocols and algorithms that pertain to the evaluation of patients at risk for COVID-19 are in a state of rapid change based on information released by regulatory bodies including the CDC and federal and state organizations. These policies and algorithms were followed during the patient's care in the ED.  As part of my medical decision making, I reviewed the following data within the [place] notes reviewed and incorporated, Labs reviewed, notes from prior ED visits and [place] Controlled Substance Database   ____________________________________________   FINAL CLINICAL IMPRESSION(S) / ED DIAGNOSES  Final diagnoses:  Nonintractable headache, unspecified chronicity pattern, unspecified headache type      NEW MEDICATIONS STARTED DURING THIS VISIT:  New Prescriptions   AZITHROMYCIN (ZITHROMAX) 500 MG TABLET    Take 1 tablet (500 mg total) by mouth daily for 3 days. Take 1 tablet daily for 3 days.   ONDANSETRON (ZOFRAN) 4 MG TABLET    Take 1 tablet (4 mg total) by mouth daily as needed.     Note:  This document was prepared  using Dragon voice recognition software and may include unintentional dictation errors.    Merlyn Lot, MD 07/14/19 2016

## 2019-07-14 NOTE — ED Triage Notes (Signed)
Pt has multiple complaints - low mid abd pain x 3 days, chest pain, back pain and headache x 3 weeks. States went to see a doctor a few weeks ago but doesn't know the diagnosis.

## 2019-07-14 NOTE — ED Notes (Signed)
2nd urine sample sent to lab. Bed locked low. Rail up. Call bell within reach. HOB adjusted per pt request.

## 2019-07-14 NOTE — ED Notes (Signed)
Introduced self to pt. Gave pt warm blanket. Bed locked low. Rail up. Pt reports intermittent chest pain, HA, and nausea. Reports had covid months ago and hasn't felt right since. Breathing regular/unlabored. Dec appetite. Reports coughing white phlegm.

## 2019-07-14 NOTE — Discharge Instructions (Addendum)

## 2019-08-02 DIAGNOSIS — E1165 Type 2 diabetes mellitus with hyperglycemia: Secondary | ICD-10-CM | POA: Diagnosis not present

## 2019-08-02 DIAGNOSIS — R69 Illness, unspecified: Secondary | ICD-10-CM | POA: Diagnosis not present

## 2019-08-02 DIAGNOSIS — J3089 Other allergic rhinitis: Secondary | ICD-10-CM | POA: Diagnosis not present

## 2019-08-02 DIAGNOSIS — I10 Essential (primary) hypertension: Secondary | ICD-10-CM | POA: Diagnosis not present

## 2019-10-28 ENCOUNTER — Other Ambulatory Visit: Payer: Self-pay | Admitting: Family Medicine

## 2019-10-28 DIAGNOSIS — Z1231 Encounter for screening mammogram for malignant neoplasm of breast: Secondary | ICD-10-CM

## 2019-11-09 ENCOUNTER — Ambulatory Visit: Payer: Medicare HMO

## 2019-11-16 ENCOUNTER — Other Ambulatory Visit: Payer: Self-pay

## 2019-11-16 ENCOUNTER — Ambulatory Visit
Admission: RE | Admit: 2019-11-16 | Discharge: 2019-11-16 | Disposition: A | Discharge status: Home / Self Care | Payer: Medicare HMO | Source: Ambulatory Visit | Attending: Family Medicine | Admitting: Family Medicine

## 2019-11-16 ENCOUNTER — Ambulatory Visit
Admission: RE | Admit: 2019-11-16 | Discharge: 2019-11-16 | Disposition: A | Discharge status: Home / Self Care | Payer: Medicare HMO | Source: Ambulatory Visit | Attending: Physician Assistant | Admitting: Physician Assistant

## 2019-11-16 DIAGNOSIS — E049 Nontoxic goiter, unspecified: Secondary | ICD-10-CM | POA: Insufficient documentation

## 2019-11-16 DIAGNOSIS — I700 Atherosclerosis of aorta: Secondary | ICD-10-CM | POA: Diagnosis not present

## 2019-11-16 DIAGNOSIS — Z1231 Encounter for screening mammogram for malignant neoplasm of breast: Secondary | ICD-10-CM | POA: Insufficient documentation

## 2019-11-16 DIAGNOSIS — R911 Solitary pulmonary nodule: Secondary | ICD-10-CM | POA: Insufficient documentation

## 2019-11-16 DIAGNOSIS — J439 Emphysema, unspecified: Secondary | ICD-10-CM | POA: Diagnosis not present

## 2019-12-19 ENCOUNTER — Other Ambulatory Visit: Payer: Self-pay | Admitting: Family Medicine

## 2019-12-19 DIAGNOSIS — E049 Nontoxic goiter, unspecified: Secondary | ICD-10-CM

## 2019-12-23 ENCOUNTER — Other Ambulatory Visit: Payer: Self-pay

## 2019-12-23 ENCOUNTER — Ambulatory Visit
Admission: RE | Admit: 2019-12-23 | Discharge: 2019-12-23 | Disposition: A | Discharge status: Home / Self Care | Payer: Medicare HMO | Source: Ambulatory Visit | Attending: Family Medicine | Admitting: Family Medicine

## 2019-12-23 DIAGNOSIS — E049 Nontoxic goiter, unspecified: Secondary | ICD-10-CM

## 2020-01-26 ENCOUNTER — Other Ambulatory Visit: Payer: Self-pay | Admitting: Otolaryngology

## 2020-01-26 DIAGNOSIS — E041 Nontoxic single thyroid nodule: Secondary | ICD-10-CM

## 2020-02-03 ENCOUNTER — Ambulatory Visit
Admission: RE | Admit: 2020-02-03 | Discharge: 2020-02-03 | Disposition: A | Discharge status: Home / Self Care | Payer: Medicare HMO | Source: Ambulatory Visit | Attending: Otolaryngology | Admitting: Otolaryngology

## 2020-02-03 ENCOUNTER — Other Ambulatory Visit: Payer: Self-pay

## 2020-02-03 DIAGNOSIS — E041 Nontoxic single thyroid nodule: Secondary | ICD-10-CM | POA: Diagnosis not present

## 2020-02-06 LAB — CYTOLOGY - NON PAP

## 2020-02-10 ENCOUNTER — Other Ambulatory Visit: Payer: Self-pay | Admitting: Otolaryngology

## 2020-02-10 DIAGNOSIS — E041 Nontoxic single thyroid nodule: Secondary | ICD-10-CM

## 2020-06-06 ENCOUNTER — Ambulatory Visit: Payer: Medicare HMO | Admitting: Dermatology

## 2020-07-16 ENCOUNTER — Ambulatory Visit: Payer: Medicare HMO | Admitting: Dermatology

## 2020-07-16 ENCOUNTER — Other Ambulatory Visit: Payer: Self-pay

## 2020-07-16 DIAGNOSIS — D2371 Other benign neoplasm of skin of right lower limb, including hip: Secondary | ICD-10-CM

## 2020-07-16 DIAGNOSIS — D239 Other benign neoplasm of skin, unspecified: Secondary | ICD-10-CM

## 2020-07-16 DIAGNOSIS — L819 Disorder of pigmentation, unspecified: Secondary | ICD-10-CM

## 2020-07-16 MED ORDER — HYDROQUINONE 4 % EX CREA: TOPICAL_CREAM | Freq: Two times a day (BID) | CUTANEOUS | 1 refills | Status: AC

## 2020-07-16 NOTE — Progress Notes (Signed)
   New Patient Visit  Subjective  Monique Carlson is a 70 y.o. female who presents for the following: Skin Discoloration (Bil lower legs, hx of bites on legs, ~65m).  New patient referral from Dr. Denton Lank.  The following portions of the chart were reviewed this encounter and updated as appropriate:       Review of Systems:  No other skin or systemic complaints except as noted in HPI or Assessment and Plan.  Objective  Well appearing patient in no apparent distress; mood and affect are within normal limits.  A focused examination was performed including bil lower legs. Relevant physical exam findings are noted in the Assessment and Plan.  Objective  L pretibial, R calf: Hyperpigmented macules/patches  Images      Objective  R pretibia: Med dark brown firm pap 4.73mm   Assessment & Plan  Post-inflammatory pigmentary changes L pretibial, R calf  Post inflammatory hyperpigmentation, h/o spider bites  Discussed may fade but may not clear completely  Start Hydroquinone 4% cr bid aa legs Recommend daily broad spectrum sunscreen SPF 30+ to sun-exposed areas, reapply every 2 hours as needed.   hydroquinone 4 % cream - L pretibial, R calf  Dermatofibroma R pretibia  Benign-appearing, observe  Return in about 2 months (around 09/13/2020) for f/u Post inflammatory hyperpigmentation.  I, Othelia Pulling, RMA, am acting as scribe for Brendolyn Patty, MD . Documentation: I have reviewed the above documentation for accuracy and completeness, and I agree with the above.  Brendolyn Patty MD

## 2020-08-24 ENCOUNTER — Ambulatory Visit: Admission: RE | Admit: 2020-08-24 | Payer: Medicare HMO | Source: Ambulatory Visit

## 2020-09-10 ENCOUNTER — Ambulatory Visit: Payer: Medicare HMO | Admitting: Dermatology

## 2020-09-10 ENCOUNTER — Other Ambulatory Visit: Payer: Self-pay

## 2020-09-10 DIAGNOSIS — L819 Disorder of pigmentation, unspecified: Secondary | ICD-10-CM

## 2020-09-10 NOTE — Progress Notes (Signed)
   Follow-Up Visit   Subjective  Monique Carlson is a 70 y.o. female who presents for the following: Follow-up (Patient here for 2 month follow-up post-inflammatory pigmentary changes of the left pretibia and right calf (hx of spider bites). Areas have faded some using 4% hydroquinone cream twice daily.) and Spot (Right upper arm x months. Not itchy.).  No h/o trauma or rash.  The following portions of the chart were reviewed this encounter and updated as appropriate:       Review of Systems:  No other skin or systemic complaints except as noted in HPI or Assessment and Plan.  Objective  Well appearing patient in no apparent distress; mood and affect are within normal limits.  A focused examination was performed including arms, legs. Relevant physical exam findings are noted in the Assessment and Plan.  Objective  Left pretibia, Right calf: Hyperpigmented patches with slight fading when compared to photos. Hyperpigmented patch on the right lateral elbow.   Assessment & Plan  Post-inflammatory pigmentary changes Left pretibia, Right calf  Some improvement noted when compared to previous photos (left pretibia, right calf).  Continue 4% hydroquinone cream BID to AAs. Start applying to right lateral elbow.  When finished with hydroquinone cream, may start INKEY Tranexamic Acid qhs.   Advised patient this may take several months to fade, and may not completely go away.  Recommend broad spectrum SPF 30 or higher and photoprotection.   Other Related Medications hydroquinone 4 % cream  Return in about 6 months (around 03/12/2021) for PIH.   IJamesetta Orleans, CMA, am acting as scribe for Brendolyn Patty, MD .  Documentation: I have reviewed the above documentation for accuracy and completeness, and I agree with the above.  Brendolyn Patty MD

## 2020-09-10 NOTE — Patient Instructions (Addendum)
Recommend broad spectrum SPF 30 or highter and photoprotection.  Continue 4% hydroquinone cream twice daily to dark spots on legs and right elbow.  When finished with hydroquinone cream, may start INKEY Tranexamic Acid.  If you have any questions or concerns for your doctor, please call our main line at [phone] and press option 4 to reach your doctor's medical assistant. If no one answers, please leave a voicemail as directed and we will return your call as soon as possible. Messages left after 4 pm will be answered the following business day.   You may also send [place] a message via [place]. We typically respond to MyChart messages within 1-2 business days.  For prescription refills, please ask your pharmacy to contact our office. Our fax number is [phone].  If you have an urgent issue when the clinic is closed that cannot wait until the next business day, you can page your doctor at the number below.    Please note that while we do our best to be available for urgent issues outside of office hours, we are not available 24/7.   If you have an urgent issue and are unable to reach [place], you may choose to seek medical care at your doctor's office, retail clinic, urgent care center, or emergency room.  If you have a medical emergency, please immediately call 911 or go to the emergency department.  Pager Numbers  - Dr. Nehemiah Massed: [phone]  - Dr. Laurence Ferrari: [phone]  - Dr. Nicole Kindred: [phone]  In the event of inclement weather, please call our main line at [phone] for an update on the status of any delays or closures.  Dermatology Medication Tips: Please keep the boxes that topical medications come in in order to help keep track of the instructions about where and how to use these. Pharmacies typically print the medication instructions only on the boxes and not directly on the medication tubes.   If your medication is too expensive, please contact our office at [phone]  option 4 or send [place] a message through [place].   We are unable to tell what your co-pay for medications will be in advance as this is different depending on your insurance coverage. However, we may be able to find a substitute medication at lower cost or fill out paperwork to get insurance to cover a needed medication.   If a prior authorization is required to get your medication covered by your insurance company, please allow [place] 1-2 business days to complete this process.  Drug prices often vary depending on where the prescription is filled and some pharmacies may offer cheaper prices.  The website www.goodrx.com contains coupons for medications through different pharmacies. The prices here do not account for what the cost may be with help from insurance (it may be cheaper with your insurance), but the website can give you the price if you did not use any insurance.  - You can print the associated coupon and take it with your prescription to the pharmacy.  - You may also stop by our office during regular business hours and pick up a GoodRx coupon card.  - If you need your prescription sent electronically to a different pharmacy, notify our office through [facility] or by phone at [phone] option 4.

## 2020-11-13 ENCOUNTER — Other Ambulatory Visit: Payer: Self-pay | Admitting: Physician Assistant

## 2020-11-13 DIAGNOSIS — Z1231 Encounter for screening mammogram for malignant neoplasm of breast: Secondary | ICD-10-CM

## 2020-12-28 ENCOUNTER — Ambulatory Visit
Admission: RE | Admit: 2020-12-28 | Discharge: 2020-12-28 | Disposition: A | Discharge status: Home / Self Care | Payer: Medicare HMO | Source: Ambulatory Visit | Attending: Physician Assistant | Admitting: Physician Assistant

## 2020-12-28 ENCOUNTER — Other Ambulatory Visit: Payer: Self-pay

## 2020-12-28 DIAGNOSIS — Z1231 Encounter for screening mammogram for malignant neoplasm of breast: Secondary | ICD-10-CM

## 2021-01-22 ENCOUNTER — Other Ambulatory Visit: Payer: Medicare HMO

## 2021-01-30 ENCOUNTER — Ambulatory Visit: Payer: Medicare HMO | Attending: Otolaryngology

## 2021-03-11 ENCOUNTER — Ambulatory Visit: Payer: Medicare HMO | Admitting: Dermatology

## 2021-09-12 ENCOUNTER — Ambulatory Visit
Admission: EM | Admit: 2021-09-12 | Discharge: 2021-09-12 | Disposition: A | Discharge status: Home / Self Care | Payer: Medicare HMO | Attending: Emergency Medicine | Admitting: Emergency Medicine

## 2021-09-12 ENCOUNTER — Encounter: Payer: Self-pay | Admitting: Emergency Medicine

## 2021-09-12 DIAGNOSIS — R490 Dysphonia: Secondary | ICD-10-CM | POA: Diagnosis not present

## 2021-09-12 DIAGNOSIS — R0981 Nasal congestion: Secondary | ICD-10-CM

## 2021-09-12 DIAGNOSIS — J302 Other seasonal allergic rhinitis: Secondary | ICD-10-CM | POA: Diagnosis not present

## 2021-09-12 DIAGNOSIS — R051 Acute cough: Secondary | ICD-10-CM | POA: Diagnosis not present

## 2021-09-12 MED ORDER — FLUTICASONE PROPIONATE 50 MCG/ACT NA SUSP: 1.0000 | Freq: Every day | NASAL | 0 refills | Status: AC

## 2021-09-12 MED ORDER — CETIRIZINE HCL 10 MG PO TABS: 10.0000 mg | ORAL_TABLET | Freq: Every day | ORAL | 0 refills | Status: AC

## 2021-09-12 MED ORDER — BENZONATATE 100 MG PO CAPS: 100.0000 mg | ORAL_CAPSULE | Freq: Three times a day (TID) | ORAL | 0 refills | Status: AC | PRN

## 2021-09-12 NOTE — ED Triage Notes (Addendum)
Pt here with productive cough, nasal congestion and hoarseness without sore throat x 4 days.  ?  ?

## 2021-09-12 NOTE — Discharge Instructions (Addendum)
Use the Flonase nasal spray and take the Zyrtec as directed.  Take the [facility] as needed for cough.  Follow up with your primary care provider if your symptoms are not improving.   ? ?

## 2021-09-12 NOTE — ED Provider Notes (Signed)
?[facility] ? ? ? ?CSN: [id number] ?Arrival date & time: 09/12/21  1321 ? ? ?  ? ?History   ?Chief Complaint ?Chief Complaint  ?Patient presents with  ? Nasal Congestion  ? Cough  ? Hoarse  ? ? ?HPI ?Monique Carlson is a 71 y.o. female.  Patient presents with 4-day history of nasal congestion, postnasal drip, hoarse voice, cough productive of clear sputum.  Treatment at home with OTC sinus medication.  She denies fever, sore throat, shortness of breath, vomiting, diarrhea, or other symptoms.  Her medical history includes diabetes and hypothyroidism. ? ?The history is provided by the patient and medical records.  ? ?Past Medical History:  ?Diagnosis Date  ? Diabetes mellitus without complication ([place])   ? Hypothyroidism   ? ? ?There are no problems to display for this patient. ? ? ?Past Surgical History:  ?Procedure Laterality Date  ? ABDOMINAL HYSTERECTOMY    ? COLONOSCOPY WITH PROPOFOL N/A 07/30/2017  ? Procedure: COLONOSCOPY WITH PROPOFOL;  Surgeon: Jonathon Bellows, MD;  Location: [facility];  Service: Gastroenterology;  Laterality: N/A;  ? ? ?OB History   ?No obstetric history on file. ?  ? ? ? ?Home Medications   ? ?Prior to Admission medications   ?Medication Sig Start Date End Date Taking? Authorizing Provider  ?benzonatate (TESSALON) 100 MG capsule Take 1 capsule (100 mg total) by mouth 3 (three) times daily as needed for cough. 09/12/21  Yes Sharion Balloon, NP  ?cetirizine (ZYRTEC ALLERGY) 10 MG tablet Take 1 tablet (10 mg total) by mouth daily. 09/12/21  Yes Sharion Balloon, NP  ?fluticasone (FLONASE) 50 MCG/ACT nasal spray Place 1 spray into both nostrils daily. 09/12/21  Yes Sharion Balloon, NP  ?amLODipine (NORVASC) 5 MG tablet Take 5 mg by mouth daily.    [provider]  ?glyBURIDE (DIABETA) 5 MG tablet Take 1 tablet (5 mg total) by mouth daily with breakfast. 03/11/16   Gregor Hams, MD  ?hydroquinone 4 % cream Apply topically 2 (two) times daily. Bid to dark spots on legs 07/16/20    Brendolyn Patty, MD  ?insulin detemir (LEVEMIR) 100 UNIT/ML injection Inject 10 Units into the skin at bedtime.    [provider]  ?levothyroxine (SYNTHROID, LEVOTHROID) 25 MCG tablet Take 25 mcg by mouth daily before breakfast.    [provider]  ?linagliptin (TRADJENTA) 5 MG TABS tablet Take 5 mg by mouth daily.    [provider]  ?meloxicam (MOBIC) 15 MG tablet Take 15 mg by mouth daily.    [provider]  ?Omega-3 Fatty Acids (FISH OIL PO) Take by mouth.    [provider]  ?ondansetron (ZOFRAN ODT) 4 MG disintegrating tablet Take 1 tablet (4 mg total) by mouth every 6 (six) hours as needed for nausea or vomiting. 05/20/19   Delman Kitten, MD  ? ? ?Family History ?Family History  ?Problem Relation Age of Onset  ? Breast cancer Neg Hx   ? ? ?Social History ?Social History  ? ?Tobacco Use  ? Smoking status: Former  ?  Types: Cigarettes  ? Smokeless tobacco: Current  ?Substance Use Topics  ? Alcohol use: Yes  ?  Comment: occ  ? Drug use: Never  ? ? ? ?Allergies   ?Metformin and related ? ? ?Review of Systems ?Review of Systems  ?Constitutional:  Negative for chills and fever.  ?HENT:  Positive for congestion, postnasal drip and voice change. Negative for ear pain and sore throat.   ?  Respiratory:  Positive for cough. Negative for shortness of breath.   ?Cardiovascular:  Negative for chest pain and palpitations.  ?Gastrointestinal:  Negative for diarrhea and vomiting.  ?Skin:  Negative for color change and rash.  ?Neurological:  Negative for syncope.  ?All other systems reviewed and are negative. ? ? ?Physical Exam ?Triage Vital Signs ?ED Triage Vitals  ?Enc Vitals Group  ?   BP 09/12/21 1424 125/68  ?   Pulse Rate 09/12/21 1424 76  ?   Resp 09/12/21 1424 18  ?   Temp 09/12/21 1424 98.3 ?F (36.8 ?C)  ?   Temp src --   ?   SpO2 09/12/21 1424 97 %  ?   Weight --   ?   Height --   ?   Head Circumference --   ?   Peak Flow --   ?   Pain Score 09/12/21 1433 0  ?   Pain Loc --   ?    Pain Edu? --   ?   Excl. in [place]? --   ? ?No data found. ? ?Updated Vital Signs ?BP 125/68   Pulse 76   Temp 98.3 ?F (36.8 ?C)   Resp 18   SpO2 97%  ? ?Visual Acuity ?Right Eye Distance:   ?Left Eye Distance:   ?Bilateral Distance:   ? ?Right Eye Near:   ?Left Eye Near:    ?Bilateral Near:    ? ?Physical Exam ?Vitals and nursing note reviewed.  ?Constitutional:   ?   General: She is not in acute distress. ?   Appearance: Normal appearance. She is well-developed. She is not ill-appearing.  ?HENT:  ?   Right Ear: Tympanic membrane normal.  ?   Left Ear: Tympanic membrane normal.  ?   Nose: Nose normal.  ?   Mouth/Throat:  ?   Mouth: Mucous membranes are moist.  ?   Pharynx: Oropharynx is clear.  ?   Comments: Clear postnasal drip. ?Cardiovascular:  ?   Rate and Rhythm: Normal rate and regular rhythm.  ?   Heart sounds: Normal heart sounds.  ?Pulmonary:  ?   Effort: Pulmonary effort is normal. No respiratory distress.  ?   Breath sounds: Normal breath sounds.  ?Musculoskeletal:  ?   Cervical back: Neck supple.  ?Skin: ?   General: Skin is warm and dry.  ?Neurological:  ?   Mental Status: She is alert.  ?Psychiatric:     ?   Mood and Affect: Mood normal.     ?   Behavior: Behavior normal.  ? ? ? ?UC Treatments / Results  ?Labs ?(all labs ordered are listed, but only abnormal results are displayed) ?Labs Reviewed - No data to display ? ?EKG ? ? ?Radiology ?No results found. ? ?Procedures ?Procedures (including critical care time) ? ?Medications Ordered in UC ?Medications - No data to display ? ?Initial Impression / Assessment and Plan / UC Course  ?I have reviewed the triage vital signs and the nursing notes. ? ?Pertinent labs & imaging results that were available during my care of the patient were reviewed by me and considered in my medical decision making (see chart for details). ? ?  ?Seasonal allergies, cough, congestion, hoarseness.  Treating with Flonase nasal spray, Zyrtec, Tessalon Perles.  Education provided  on seasonal allergies.  Instructed patient to follow-up with her PCP if her symptoms are not improving.  She agrees to plan of care. ? ?Final Clinical Impressions(s) / UC Diagnoses  ? ?  Final diagnoses:  ?Seasonal allergies  ?Acute cough  ?Nasal congestion  ?Hoarseness  ? ? ? ?Discharge Instructions   ? ?  ?Use the Flonase nasal spray and take the Zyrtec as directed.  Take the [facility] as needed for cough.  Follow up with your primary care provider if your symptoms are not improving.   ? ? ? ? ? ?ED Prescriptions   ? ? Medication Sig Dispense Auth. Provider  ? fluticasone (FLONASE) 50 MCG/ACT nasal spray Place 1 spray into both nostrils daily. 16 g Sharion Balloon, NP  ? cetirizine (ZYRTEC ALLERGY) 10 MG tablet Take 1 tablet (10 mg total) by mouth daily. 30 tablet Sharion Balloon, NP  ? benzonatate (TESSALON) 100 MG capsule Take 1 capsule (100 mg total) by mouth 3 (three) times daily as needed for cough. 21 capsule Sharion Balloon, NP  ? ?  ? ?PDMP not reviewed this encounter. ?  ?Sharion Balloon, NP ?09/12/21 1454 ? ?

## 2022-04-06 IMAGING — MG DIGITAL SCREENING BILAT W/ TOMO W/ CAD
8 series · 8 of 24 positions shown · non-contrast
Comparison: Previous exam(s).

CLINICAL DATA: Screening.

EXAM:
DIGITAL SCREENING BILATERAL MAMMOGRAM WITH TOMO AND CAD

[R MLO synth-2D]
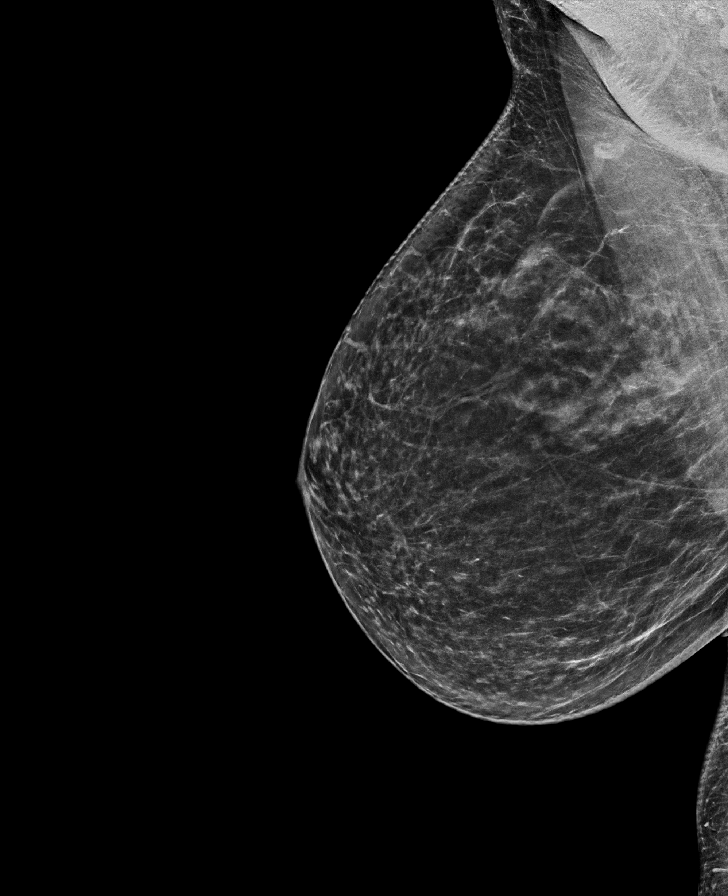

[R CC synth-2D]
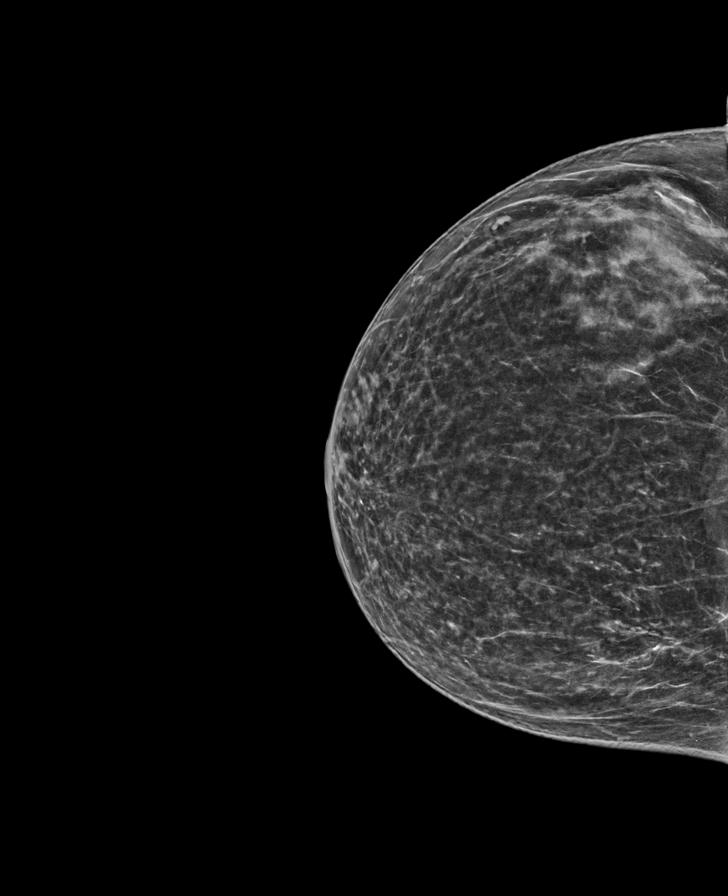

[L MLO synth-2D]
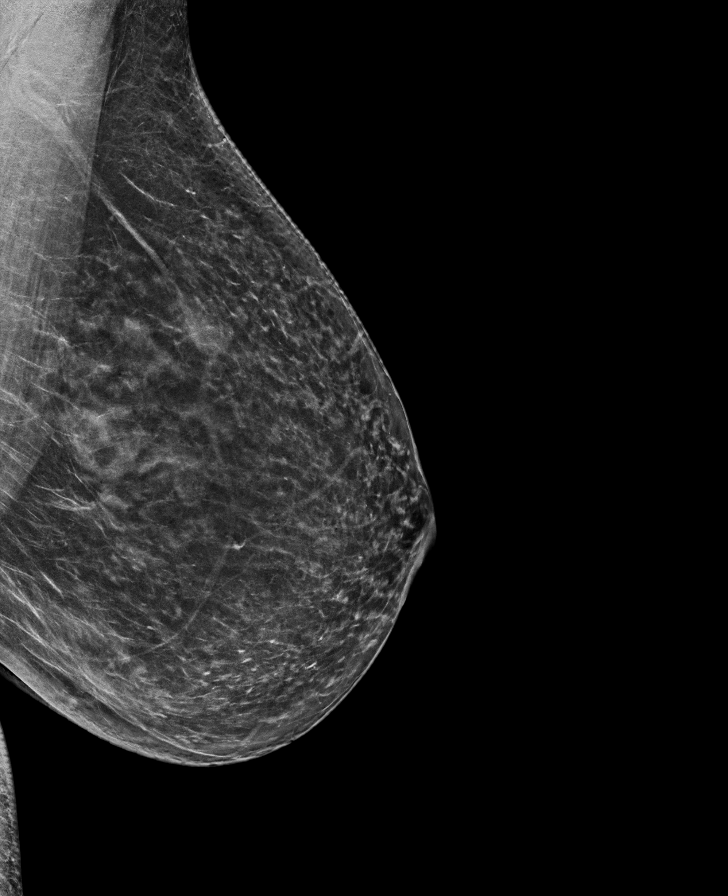

[L CC synth-2D]
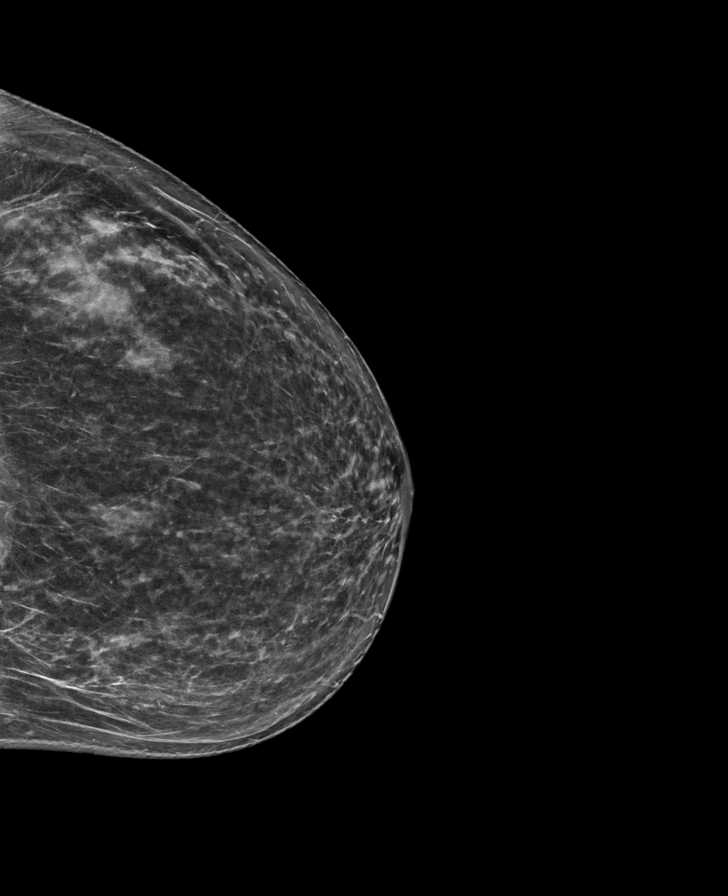

[R MLO tomo · tomo slice 39/78.0]
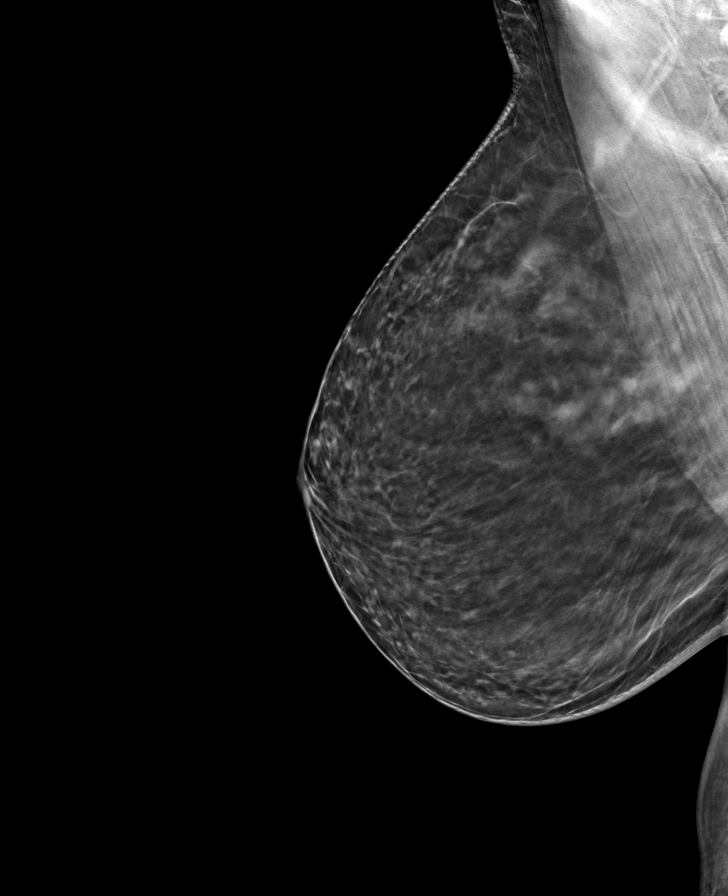

[R CC tomo · tomo slice 35/69.0]
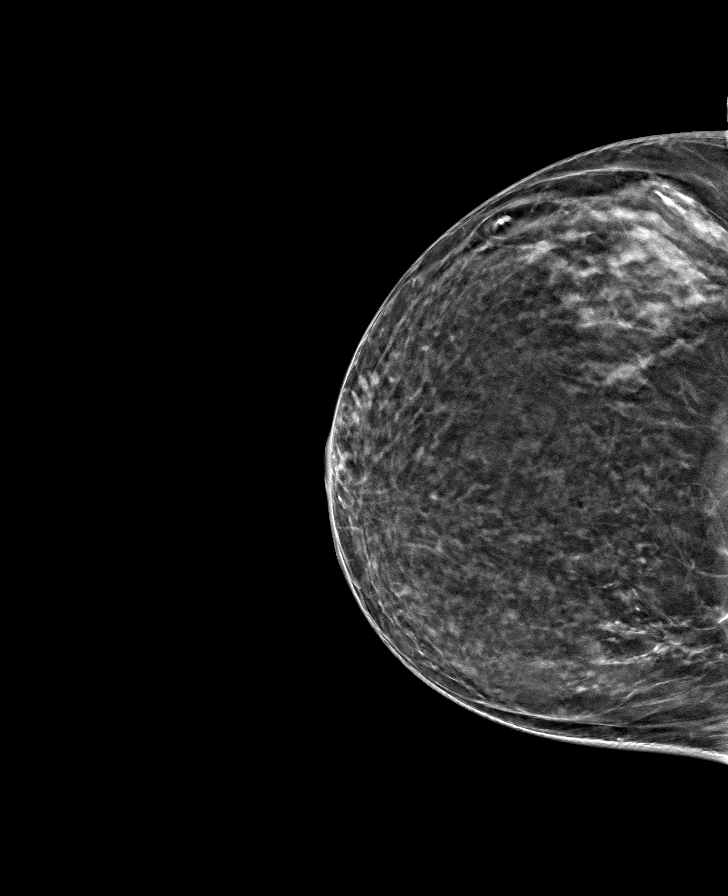

[L MLO tomo · tomo slice 39/76.0]
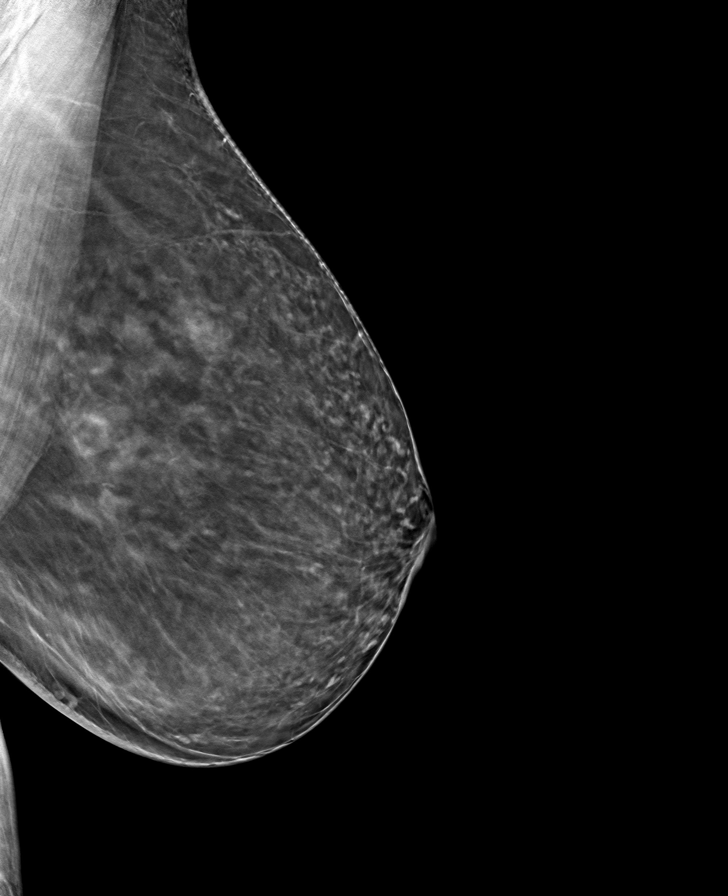

[L CC tomo · tomo slice 37/72.0]
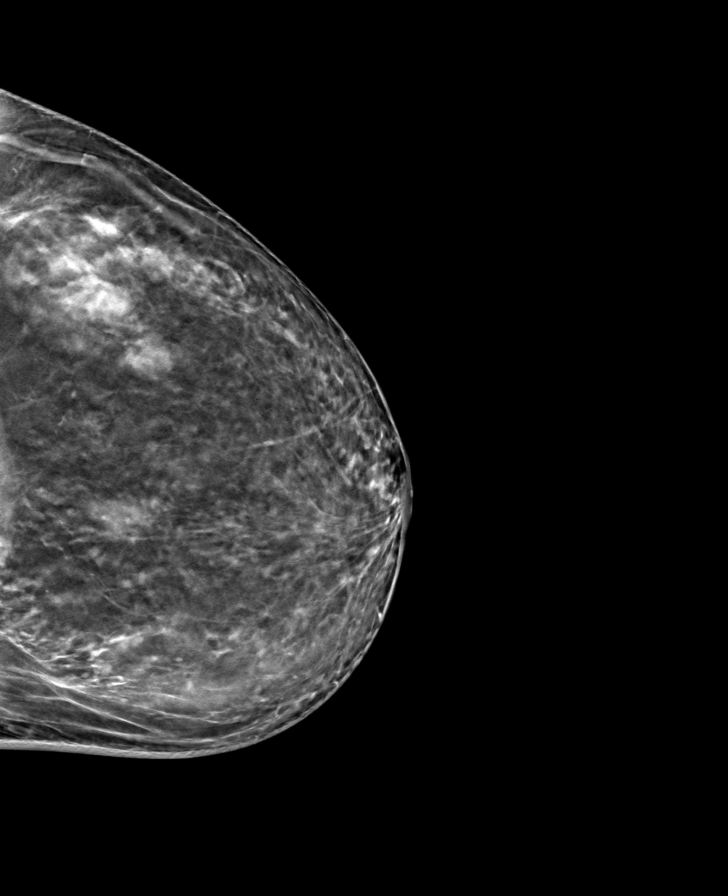

[8 of 24 positions shown; findings below may reference images not displayed]

ACR Breast Density Category b: There are scattered areas of
fibroglandular density.
FINDINGS: There are no findings suspicious for malignancy. Images were
processed with CAD.
IMPRESSION: No mammographic evidence of malignancy. A result letter of this
screening mammogram will be mailed directly to the patient.

RECOMMENDATION:
Screening mammogram in one year. (Code:[id number])

BI-RADS CATEGORY  1: Negative.

## 2022-07-01 ENCOUNTER — Other Ambulatory Visit: Payer: Self-pay | Admitting: Primary Care

## 2022-07-01 DIAGNOSIS — Z1231 Encounter for screening mammogram for malignant neoplasm of breast: Secondary | ICD-10-CM

## 2022-08-01 ENCOUNTER — Ambulatory Visit
Admission: RE | Admit: 2022-08-01 | Discharge: 2022-08-01 | Disposition: A | Discharge status: Home / Self Care | Payer: Medicare HMO | Source: Ambulatory Visit | Attending: Primary Care | Admitting: Primary Care

## 2022-08-01 DIAGNOSIS — Z1231 Encounter for screening mammogram for malignant neoplasm of breast: Secondary | ICD-10-CM | POA: Insufficient documentation
